# Patient Record
Sex: Female | Born: 1985 | Race: White | Hispanic: No | Marital: Single | Smoking: Former smoker
Health system: Southern US, Community
[De-identification: ages and names within clinical notes are randomized; demographics above are authoritative.]

## PROBLEM LIST (undated history)

## (undated) DIAGNOSIS — F32A Depression, unspecified: Secondary | ICD-10-CM

## (undated) DIAGNOSIS — F329 Major depressive disorder, single episode, unspecified: Secondary | ICD-10-CM

## (undated) DIAGNOSIS — F319 Bipolar disorder, unspecified: Secondary | ICD-10-CM

## (undated) DIAGNOSIS — F419 Anxiety disorder, unspecified: Secondary | ICD-10-CM

## (undated) DIAGNOSIS — I10 Essential (primary) hypertension: Secondary | ICD-10-CM

## (undated) HISTORY — DX: Anxiety disorder, unspecified: F41.9

## (undated) HISTORY — DX: Essential (primary) hypertension: I10

## (undated) HISTORY — PX: WISDOM TOOTH EXTRACTION: SHX21

## (undated) HISTORY — PX: TONSILLECTOMY: SUR1361

## (undated) HISTORY — DX: Major depressive disorder, single episode, unspecified: F32.9

## (undated) HISTORY — DX: Depression, unspecified: F32.A

---
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
## 2007-04-30 ENCOUNTER — Other Ambulatory Visit: Admission: RE | Admit: 2007-04-30 | Discharge: 2007-04-30 | Payer: Self-pay | Admitting: Gynecology

## 2007-07-17 ENCOUNTER — Emergency Department (HOSPITAL_COMMUNITY): Admission: EM | Admit: 2007-07-17 | Discharge: 2007-07-17 | Payer: Self-pay | Admitting: Emergency Medicine

## 2008-01-01 ENCOUNTER — Ambulatory Visit: Payer: Self-pay | Admitting: Gynecology

## 2008-01-01 ENCOUNTER — Other Ambulatory Visit: Admission: RE | Admit: 2008-01-01 | Discharge: 2008-01-01 | Payer: Self-pay | Admitting: Gynecology

## 2008-01-01 ENCOUNTER — Encounter: Payer: Self-pay | Admitting: Gynecology

## 2009-01-06 ENCOUNTER — Other Ambulatory Visit: Admission: RE | Admit: 2009-01-06 | Discharge: 2009-01-06 | Payer: Self-pay | Admitting: Gynecology

## 2009-01-06 ENCOUNTER — Encounter: Payer: Self-pay | Admitting: Gynecology

## 2009-01-06 ENCOUNTER — Ambulatory Visit: Payer: Self-pay | Admitting: Gynecology

## 2009-01-11 ENCOUNTER — Ambulatory Visit: Payer: Self-pay | Admitting: Gynecology

## 2010-01-07 ENCOUNTER — Other Ambulatory Visit: Admission: RE | Admit: 2010-01-07 | Discharge: 2010-01-07 | Payer: Self-pay | Admitting: Gynecology

## 2010-01-07 ENCOUNTER — Ambulatory Visit: Payer: Self-pay | Admitting: Gynecology

## 2010-12-30 ENCOUNTER — Encounter: Payer: Self-pay | Admitting: Anesthesiology

## 2011-01-09 ENCOUNTER — Other Ambulatory Visit (HOSPITAL_COMMUNITY)
Admission: RE | Admit: 2011-01-09 | Discharge: 2011-01-09 | Disposition: A | Discharge status: Home / Self Care | Payer: BC Managed Care – PPO | Source: Ambulatory Visit | Attending: Gynecology | Admitting: Gynecology

## 2011-01-09 ENCOUNTER — Ambulatory Visit (INDEPENDENT_AMBULATORY_CARE_PROVIDER_SITE_OTHER): Payer: BC Managed Care – PPO | Admitting: Gynecology

## 2011-01-09 ENCOUNTER — Encounter: Payer: Self-pay | Admitting: Gynecology

## 2011-01-09 VITALS — BP 130/86 | Ht 62.0 in | Wt 159.0 lb

## 2011-01-09 DIAGNOSIS — Z01419 Encounter for gynecological examination (general) (routine) without abnormal findings: Secondary | ICD-10-CM | POA: Insufficient documentation

## 2011-01-09 DIAGNOSIS — Z131 Encounter for screening for diabetes mellitus: Secondary | ICD-10-CM

## 2011-01-09 DIAGNOSIS — R823 Hemoglobinuria: Secondary | ICD-10-CM

## 2011-01-09 DIAGNOSIS — IMO0001 Reserved for inherently not codable concepts without codable children: Secondary | ICD-10-CM

## 2011-01-09 DIAGNOSIS — N946 Dysmenorrhea, unspecified: Secondary | ICD-10-CM | POA: Insufficient documentation

## 2011-01-09 DIAGNOSIS — R635 Abnormal weight gain: Secondary | ICD-10-CM

## 2011-01-09 DIAGNOSIS — Z113 Encounter for screening for infections with a predominantly sexual mode of transmission: Secondary | ICD-10-CM

## 2011-01-09 DIAGNOSIS — Z309 Encounter for contraceptive management, unspecified: Secondary | ICD-10-CM

## 2011-01-09 MED ORDER — MEFENAMIC ACID 250 MG PO CAPS: 1.0000 | ORAL_CAPSULE | Freq: Four times a day (QID) | ORAL | Status: DC | PRN

## 2011-01-09 MED ORDER — LEVONORGESTREL-ETHINYL ESTRAD 0.1-20 MG-MCG PO TABS: 1.0000 | ORAL_TABLET | Freq: Every day | ORAL | Status: DC

## 2011-01-09 NOTE — Progress Notes (Signed)
Shannon Sherman 1986-02-09 [id number]   History:    25 y.o.  for annual exam with complaint of dysmenorrhea. She has been on Yasmin oral contraceptive pill. She's been followed by Dr. Tomasa Rand her psychiatrist was been monitoring her anxiety and depression. Patient has completed her Gardasil Vaccine. Patient states she does her monthly self breast examination.   Past medical history,surgical history, family history and social history were all reviewed and documented in the EPIC chart.  ROS:  Was performed and pertinent positives and negatives are included in the history.  Exam: chaperone present BP 130/86  Ht 5\' 2"  (1.575 m)  Wt 159 lb (72.122 kg)  BMI 29.08 kg/m2  LMP 12/26/2010  Body mass index is 29.08 kg/(m^2).  General appearance : Well developed well nourished female. No acute distress HEENT: Neck supple, trachea midline, no carotid bruits, no thyroidmegaly Lungs: Clear to auscultation, no rhonchi or wheezes, or rib retractions  Heart: Regular rate and rhythm, no murmurs or gallops Breast:Examined in sitting and supine position were symmetrical in appearance, no palpable masses or tenderness,  no skin retraction, no nipple inversion, no nipple discharge, no skin discoloration, no axillary or supraclavicular lymphadenopathy Abdomen: no palpable masses or tenderness, no rebound or guarding Extremities: no edema or skin discoloration or tenderness  Pelvic:  Bartholin, Urethra, Skene Glands: Within normal limits             Vagina: No gross lesions or discharge  Cervix: No gross lesions or discharge  Uterus  anteverted, normal size, shape and consistency, non-tender and mobile  Adnexa  Without masses or tenderness  Anus and perineum  normal   Rectovaginal  normal sphincter tone without palpated masses or tenderness             Hemoccult not done     Assessment/Plan:  25 y.o. female for annual exam on Yasmin oral contraceptive pill. We discussed the progesterone content of  this oral contraceptive pill (dispirinone) has been reported to potential increased risk of deep venous thrombosis and for this reason I would like to change her to a different oral contraceptive pill. She will be placed on Alesse 28 day oral contraceptive pill and to help with her dysmenorrhea she will take a continuous and withdrawal every 3 months. I've also prescribed Ponstel 250 mg one tablet 4 times a day during her menses for any dysmenorrhea. A GC and Chlamydia culture along with her CBC urinalysis and Pap smear was obtained today. She was encouraged to do her monthly self breast examination. We will see her back in one year or when necessary.   Ok Edwards MD, 12:19 PM 01/09/2011

## 2011-01-10 LAB — POCT RAPID STREP A: Streptococcus, Group A Screen (Direct): NEGATIVE

## 2011-06-23 ENCOUNTER — Telehealth: Payer: Self-pay | Admitting: *Deleted

## 2011-06-23 NOTE — Telephone Encounter (Signed)
Left below message on pt vm as she requested.

## 2011-06-23 NOTE — Telephone Encounter (Signed)
Pt is on Alesse 28 day oral contraceptive pill and to help with her dysmenorrhea she takes continuous and withdrawal every 3 months. Pt said that she had her period 2 weeks ago and last night noted some spotting, this am she had heavy bleeding. She changed 2 tampons within 1 hour, bleeding has slowed down. Pt takes pill at same time every day no missed pills.  Pt asked should she watch for now or make OV? Please advise

## 2011-06-23 NOTE — Telephone Encounter (Signed)
This is been isolated bleed for her. If she's been compliant in taking the pill on a regular basis I would just watch for now but if it continues to be a repetitive issue I need to come to the office for further evaluation. I would also do a home pregnancy test to make sure that she is not pregnant although the birth control pill is 99% effective.

## 2011-08-02 ENCOUNTER — Telehealth: Payer: Self-pay | Admitting: *Deleted

## 2011-08-02 MED ORDER — DROSPIRENONE-ETHINYL ESTRADIOL 3-0.03 MG PO TABS: 1.0000 | ORAL_TABLET | Freq: Every day | ORAL | Status: DC

## 2011-08-02 NOTE — Telephone Encounter (Signed)
Pt is calling requesting to start back on Yasmin birth control pills, she is currently taking Alesse 28 day. C/o ache on her face and her back, pt went to see a dermatologist and tried different treatments and the ache still remains there. Pt said this problem didn't occur until new birth control started. Please advise

## 2011-08-02 NOTE — Telephone Encounter (Signed)
Patient was seen last year in September for her annual exam. She was changed from Yasmin oral contraceptive pill to Alyce 25 year old concept pill as a result of recent findings of slightly increased risk of DVT in patients on Yasmin. Patient would like to return back to the Yasmin and will be prescribed providing that she understands of a slight increased risk. We will prescribe one month supply with 6 refills until we see her in the fall for her annual exam

## 2011-08-02 NOTE — Telephone Encounter (Signed)
Left message on pt voicemail with the below note, rx sent to pharmacy.

## 2012-10-10 ENCOUNTER — Other Ambulatory Visit: Payer: Self-pay

## 2012-10-10 ENCOUNTER — Telehealth: Payer: Self-pay

## 2012-10-10 MED ORDER — DROSPIRENONE-ETHINYL ESTRADIOL 3-0.03 MG PO TABS: 1.0000 | ORAL_TABLET | Freq: Every day | ORAL | Status: DC

## 2012-10-10 NOTE — Telephone Encounter (Signed)
I received faxed from pharmacy stating patient is new to them and she would like to get her Yasmin prescription filled there. They are requesting Rx.  Patient has her yearly scheduled in July. Rx e-scribed.

## 2012-11-05 ENCOUNTER — Encounter: Payer: Self-pay | Admitting: Gynecology

## 2012-11-08 ENCOUNTER — Other Ambulatory Visit: Payer: Self-pay | Admitting: Gynecology

## 2012-11-29 ENCOUNTER — Encounter: Payer: Self-pay | Admitting: Gynecology

## 2012-11-29 ENCOUNTER — Ambulatory Visit (INDEPENDENT_AMBULATORY_CARE_PROVIDER_SITE_OTHER): Payer: BC Managed Care – PPO | Admitting: Gynecology

## 2012-11-29 VITALS — BP 132/78 | Ht 62.0 in | Wt 205.0 lb

## 2012-11-29 DIAGNOSIS — R197 Diarrhea, unspecified: Secondary | ICD-10-CM

## 2012-11-29 DIAGNOSIS — R635 Abnormal weight gain: Secondary | ICD-10-CM

## 2012-11-29 DIAGNOSIS — Z01419 Encounter for gynecological examination (general) (routine) without abnormal findings: Secondary | ICD-10-CM

## 2012-11-29 LAB — CBC WITH DIFFERENTIAL/PLATELET
Basophils Relative: 0 % (ref 0–1)
Hemoglobin: 12.8 g/dL (ref 12.0–15.0)
Lymphs Abs: 2.1 10*3/uL (ref 0.7–4.0)
MCHC: 34.1 g/dL (ref 30.0–36.0)
Monocytes Relative: 9 % (ref 3–12)
Neutro Abs: 2.5 10*3/uL (ref 1.7–7.7)
Neutrophils Relative %: 49 % (ref 43–77)
RBC: 4.19 MIL/uL (ref 3.87–5.11)

## 2012-11-29 LAB — HEMOGLOBIN A1C
Hgb A1c MFr Bld: 5.2 % (ref <5.7)
Mean Plasma Glucose: 103 mg/dL (ref <117)

## 2012-11-29 MED ORDER — DROSPIRENONE-ETHINYL ESTRADIOL 3-0.03 MG PO TABS: 1.0000 | ORAL_TABLET | Freq: Every day | ORAL | Status: DC

## 2012-11-29 NOTE — Progress Notes (Signed)
Shannon Sherman September 29, 1985 [id number]   History:    27 y.o.  for annual gyn exam who has not been seen in the office since 2012. Patient states that for the past month she been complaining of loose stool. Also she been complaining of cramping. She denies any fever. Her partner has gotten L. Patient with increased weight from last year. Patient was weighing 159 now is weighing 205. She is on continuous oral contraceptive pill and withdraws every 3 months and has done well. Patient has completed the Gardasil vaccine series.  Past medical history,surgical history, family history and social history were all reviewed and documented in the EPIC chart.  Gynecologic History Patient's last menstrual period was 11/04/2012. Contraception: OCP (estrogen/progesterone) Last Pap: 2012. Results were: normal Last mammogram: none indicated. Results were: none indicated  Obstetric History OB History  Gravida Para Term Preterm AB SAB TAB Ectopic Multiple Living  0                  ROS: A ROS was performed and pertinent positives and negatives are included in the history.  GENERAL: No fevers or chills. HEENT: No change in vision, no earache, sore throat or sinus congestion. NECK: No pain or stiffness. CARDIOVASCULAR: No chest pain or pressure. No palpitations. PULMONARY: No shortness of breath, cough or wheeze. GASTROINTESTINAL: abdominal cramping and diarrhea but no blood noted GENITOURINARY: No urinary frequency, urgency, hesitancy or dysuria. MUSCULOSKELETAL: No joint or muscle pain, no back pain, no recent trauma. DERMATOLOGIC: No rash, no itching, no lesions. ENDOCRINE: No polyuria, polydipsia, no heat or cold intolerance. No recent change in weight. HEMATOLOGICAL: No anemia or easy bruising or bleeding. NEUROLOGIC: No headache, seizures, numbness, tingling or weakness. PSYCHIATRIC: No depression, no loss of interest in normal activity or change in sleep pattern.     Exam: chaperone present  BP 132/78   Ht 5\' 2"  (1.575 m)  Wt 205 lb (92.987 kg)  BMI 37.49 kg/m2  LMP 11/04/2012  Body mass index is 37.49 kg/(m^2).  General appearance : Well developed well nourished female. No acute distress HEENT: Neck supple, trachea midline, no carotid bruits, no thyroidmegaly Lungs: Clear to auscultation, no rhonchi or wheezes, or rib retractions  Heart: Regular rate and rhythm, no murmurs or gallops Breast:Examined in sitting and supine position were symmetrical in appearance, no palpable masses or tenderness,  no skin retraction, no nipple inversion, no nipple discharge, no skin discoloration, no axillary or supraclavicular lymphadenopathy Abdomen: no palpable masses or tenderness, no rebound or guarding Extremities: no edema or skin discoloration or tenderness  Pelvic:  Bartholin, Urethra, Skene Glands: Within normal limits             Vagina: No gross lesions or discharge  Cervix: No gross lesions or discharge  Uterus  anteverted, normal size, shape and consistency, non-tender and mobile  Adnexa  Without masses or tenderness  Anus and perineum  normal   Rectovaginal  normal sphincter tone without palpated masses or tenderness             Hemoccult none indicated     Assessment/Plan:  27 y.o. female for annual exam was some form of gastroenteritis. She'll be instructed to stay on a full liquid diet for the next 48 hours and uses Imodium when necessary. Will ask her to stop by the lab to obtain a container so that this weekend she can drop off for a specimen of stool for ova and parasite testing. Following last work today: CBC, hemoglobin  A1c, TSH, and comprehensive metabolic panel along with urinalysis. A screen cholesterol be drawn as well. Pap smear not done today in accordance with the new guidelines.    Ok Edwards MD, 10:22 AM 11/29/2012

## 2012-11-29 NOTE — Patient Instructions (Addendum)
Tetanus, Diphtheria, Pertussis (Tdap) Vaccine What You Need to Know WHY GET VACCINATED? Tetanus, diphtheria and pertussis can be very serious diseases, even for adolescents and adults. Tdap vaccine can protect us from these diseases. TETANUS (Lockjaw) causes painful muscle tightening and stiffness, usually all over the body.  It can lead to tightening of muscles in the head and neck so you can't open your mouth, swallow, or sometimes even breathe. Tetanus kills about 1 out of 5 people who are infected. DIPHTHERIA can cause a thick coating to form in the back of the throat.  It can lead to breathing problems, paralysis, heart failure, and death. PERTUSSIS (Whooping Cough) causes severe coughing spells, which can cause difficulty breathing, vomiting and disturbed sleep.  It can also lead to weight loss, incontinence, and rib fractures. Up to 2 in 100 adolescents and 5 in 100 adults with pertussis are hospitalized or have complications, which could include pneumonia and death. These diseases are caused by bacteria. Diphtheria and pertussis are spread from person to person through coughing or sneezing. Tetanus enters the body through cuts, scratches, or wounds. Before vaccines, the [place] saw as many as 200,000 cases a year of diphtheria and pertussis, and hundreds of cases of tetanus. Since vaccination began, tetanus and diphtheria have dropped by about 99% and pertussis by about 80%. TDAP VACCINE Tdap vaccine can protect adolescents and adults from tetanus, diphtheria, and pertussis. One dose of Tdap is routinely given at age 11 or 12. People who did not get Tdap at that age should get it as soon as possible. Tdap is especially important for health care professionals and anyone having close contact with a baby younger than 12 months. Pregnant women should get a dose of Tdap during every pregnancy, to protect the newborn from pertussis. Infants are most at risk for severe, life-threatening  complications from pertussis. A similar vaccine, called Td, protects from tetanus and diphtheria, but not pertussis. A Td booster should be given every 10 years. Tdap may be given as one of these boosters if you have not already gotten a dose. Tdap may also be given after a severe cut or burn to prevent tetanus infection. Your doctor can give you more information. Tdap may safely be given at the same time as other vaccines. SOME PEOPLE SHOULD NOT GET THIS VACCINE  If you ever had a life-threatening allergic reaction after a dose of any tetanus, diphtheria, or pertussis containing vaccine, OR if you have a severe allergy to any part of this vaccine, you should not get Tdap. Tell your doctor if you have any severe allergies.  If you had a coma, or long or multiple seizures within 7 days after a childhood dose of DTP or DTaP, you should not get Tdap, unless a cause other than the vaccine was found. You can still get Td.  Talk to your doctor if you:  have epilepsy or another nervous system problem,  had severe pain or swelling after any vaccine containing diphtheria, tetanus or pertussis,  ever had Guillain-Barr Syndrome (GBS),  aren't feeling well on the day the shot is scheduled. RISKS OF A VACCINE REACTION With any medicine, including vaccines, there is a chance of side effects. These are usually mild and go away on their own, but serious reactions are also possible. Brief fainting spells can follow a vaccination, leading to injuries from falling. Sitting or lying down for about 15 minutes can help prevent these. Tell your doctor if you feel dizzy or light-headed, or   have vision changes or ringing in the ears. Mild problems following Tdap (Did not interfere with activities)  Pain where the shot was given (about 3 in 4 adolescents or 2 in 3 adults)  Redness or swelling where the shot was given (about 1 person in 5)  Mild fever of at least 100.4F (up to about 1 in 25 adolescents or 1 in  100 adults)  Headache (about 3 or 4 people in 10)  Tiredness (about 1 person in 3 or 4)  Nausea, vomiting, diarrhea, stomach ache (up to 1 in 4 adolescents or 1 in 10 adults)  Chills, body aches, sore joints, rash, swollen glands (uncommon) Moderate problems following Tdap (Interfered with activities, but did not require medical attention)  Pain where the shot was given (about 1 in 5 adolescents or 1 in 100 adults)  Redness or swelling where the shot was given (up to about 1 in 16 adolescents or 1 in 25 adults)  Fever over 102F (about 1 in 100 adolescents or 1 in 250 adults)  Headache (about 3 in 20 adolescents or 1 in 10 adults)  Nausea, vomiting, diarrhea, stomach ache (up to 1 or 3 people in 100)  Swelling of the entire arm where the shot was given (up to about 3 in 100). Severe problems following Tdap (Unable to perform usual activities, required medical attention)  Swelling, severe pain, bleeding and redness in the arm where the shot was given (rare). A severe allergic reaction could occur after any vaccine (estimated less than 1 in a million doses). WHAT IF THERE IS A SERIOUS REACTION? What should I look for?  Look for anything that concerns you, such as signs of a severe allergic reaction, very high fever, or behavior changes. Signs of a severe allergic reaction can include hives, swelling of the face and throat, difficulty breathing, a fast heartbeat, dizziness, and weakness. These would start a few minutes to a few hours after the vaccination. What should I do?  If you think it is a severe allergic reaction or other emergency that can't wait, call 9-1-1 or get the person to the nearest hospital. Otherwise, call your doctor.  Afterward, the reaction should be reported to the "Vaccine Adverse Event Reporting System" (VAERS). Your doctor might file this report, or you can do it yourself through the VAERS web site at www.vaers.hhs.gov, or by calling 1-800-822-7967. VAERS is  only for reporting reactions. They do not give medical advice.  THE NATIONAL VACCINE INJURY COMPENSATION PROGRAM The National Vaccine Injury Compensation Program (VICP) is a federal program that was created to compensate people who may have been injured by certain vaccines. Persons who believe they may have been injured by a vaccine can learn about the program and about filing a claim by calling 1-800-338-2382 or visiting the VICP website at www.hrsa.gov/vaccinecompensation. HOW CAN I LEARN MORE?  Ask your doctor.  Call your local or state health department.  Contact the Centers for Disease Control and Prevention (CDC):  Call 1-800-232-4636 or visit CDC's website at www.cdc.gov/vaccines. CDC Tdap Vaccine VIS (08/24/11) Document Released: 10/03/2011 Document Revised: 12/27/2011 Document Reviewed: 10/03/2011 [employer] Patient Information 2014 [employer]. Diarrhea Diarrhea is frequent loose and watery bowel movements. It can cause you to feel weak and dehydrated. Dehydration can cause you to become tired and thirsty, have a dry mouth, and have decreased urination that often is dark yellow. Diarrhea is a sign of another problem, most often an infection that will not last long. In most cases, diarrhea typically   lasts 2 3 days. However, it can last longer if it is a sign of something more serious. It is important to treat your diarrhea as directed by your caregive to lessen or prevent future episodes of diarrhea. CAUSES  Some common causes include:  Gastrointestinal infections caused by viruses, bacteria, or parasites.  Food poisoning or food allergies.  Certain medicines, such as antibiotics, chemotherapy, and laxatives.  Artificial sweeteners and fructose.  Digestive disorders. HOME CARE INSTRUCTIONS  Ensure adequate fluid intake (hydration): have 1 cup (8 oz) of fluid for each diarrhea episode. Avoid fluids that contain simple sugars or sports drinks, fruit juices, whole milk  products, and sodas. Your urine should be clear or pale yellow if you are drinking enough fluids. Hydrate with an oral rehydration solution that you can purchase at pharmacies, retail stores, and online. You can prepare an oral rehydration solution at home by mixing the following ingredients together:    tsp table salt.   tsp baking soda.   tsp salt substitute containing potassium chloride.  1  tablespoons sugar.  1 L (34 oz) of water.  Certain foods and beverages may increase the speed at which food moves through the gastrointestinal (GI) tract. These foods and beverages should be avoided and include:  Caffeinated and alcoholic beverages.  High-fiber foods, such as raw fruits and vegetables, nuts, seeds, and whole grain breads and cereals.  Foods and beverages sweetened with sugar alcohols, such as xylitol, sorbitol, and mannitol.  Some foods may be well tolerated and may help thicken stool including:  Starchy foods, such as rice, toast, pasta, low-sugar cereal, oatmeal, grits, baked potatoes, crackers, and bagels.  Bananas.  Applesauce.  Add probiotic-rich foods to help increase healthy bacteria in the GI tract, such as yogurt and fermented milk products.  Wash your hands well after each diarrhea episode.  Only take over-the-counter or prescription medicines as directed by your caregiver.  Take a warm bath to relieve any burning or pain from frequent diarrhea episodes. SEEK IMMEDIATE MEDICAL CARE IF:   You are unable to keep fluids down.  You have persistent vomiting.  You have blood in your stool, or your stools are black and tarry.  You do not urinate in 6 8 hours, or there is only a small amount of very dark urine.  You have abdominal pain that increases or localizes.  You have weakness, dizziness, confusion, or lightheadedness.  You have a severe headache.  Your diarrhea gets worse or does not get better.  You have a fever or persistent symptoms for more  than 2 3 days.  You have a fever and your symptoms suddenly get worse. MAKE SURE YOU:   Understand these instructions.  Will watch your condition.  Will get help right away if you are not doing well or get worse. Document Released: 03/24/2002 Document Revised: 03/20/2012 Document Reviewed: 12/10/2011 [employer] Patient Information 2014 [employer].  

## 2012-11-30 LAB — URINALYSIS W MICROSCOPIC + REFLEX CULTURE
Bilirubin Urine: NEGATIVE
Casts: NONE SEEN
Glucose, UA: NEGATIVE mg/dL
Ketones, ur: NEGATIVE mg/dL
pH: 6 (ref 5.0–8.0)

## 2012-11-30 LAB — COMPREHENSIVE METABOLIC PANEL
ALT: 17 U/L (ref 0–35)
AST: 26 U/L (ref 0–37)
Alkaline Phosphatase: 42 U/L (ref 39–117)
CO2: 25 mEq/L (ref 19–32)
Sodium: 137 mEq/L (ref 135–145)
Total Bilirubin: 0.3 mg/dL (ref 0.3–1.2)
Total Protein: 6.4 g/dL (ref 6.0–8.3)

## 2012-11-30 LAB — CHOLESTEROL, TOTAL: Cholesterol: 212 mg/dL — ABNORMAL HIGH (ref 0–200)

## 2012-11-30 LAB — TSH: TSH: 2.953 u[IU]/mL (ref 0.350–4.500)

## 2012-12-01 LAB — URINE CULTURE: Colony Count: 4000

## 2012-12-02 ENCOUNTER — Other Ambulatory Visit: Payer: Self-pay | Admitting: Gynecology

## 2012-12-02 DIAGNOSIS — E7800 Pure hypercholesterolemia, unspecified: Secondary | ICD-10-CM

## 2012-12-02 DIAGNOSIS — R3129 Other microscopic hematuria: Secondary | ICD-10-CM

## 2012-12-04 ENCOUNTER — Ambulatory Visit: Payer: BC Managed Care – PPO

## 2012-12-04 DIAGNOSIS — R3129 Other microscopic hematuria: Secondary | ICD-10-CM

## 2012-12-04 DIAGNOSIS — E7800 Pure hypercholesterolemia, unspecified: Secondary | ICD-10-CM

## 2012-12-04 LAB — LIPID PANEL
Total CHOL/HDL Ratio: 2.2 Ratio
VLDL: 12 mg/dL (ref 0–40)

## 2012-12-05 LAB — URINALYSIS W MICROSCOPIC + REFLEX CULTURE
Bilirubin Urine: NEGATIVE
Leukocytes, UA: NEGATIVE
Protein, ur: NEGATIVE mg/dL
Urobilinogen, UA: 0.2 mg/dL (ref 0.0–1.0)

## 2012-12-06 ENCOUNTER — Other Ambulatory Visit: Payer: Self-pay | Admitting: Gynecology

## 2012-12-06 DIAGNOSIS — R3129 Other microscopic hematuria: Secondary | ICD-10-CM

## 2012-12-06 LAB — URINE CULTURE: Colony Count: 7000

## 2013-05-05 ENCOUNTER — Telehealth: Payer: Self-pay | Admitting: *Deleted

## 2013-05-05 NOTE — Telephone Encounter (Signed)
Tell her that the insurance company would give us all a hard time and she may have to foot the bill since I would have to have a medical indication of what I am diagnosing or treating. Sorry.

## 2013-05-05 NOTE — Telephone Encounter (Signed)
Pt informed with the below note. 

## 2013-05-05 NOTE — Telephone Encounter (Signed)
Pt is scheduled to have repeat u/a her physiatrist asked if a liver function test and carbamazepine level could be drawn here? Please advise

## 2013-05-06 ENCOUNTER — Ambulatory Visit: Payer: BC Managed Care – PPO

## 2013-05-06 DIAGNOSIS — R3129 Other microscopic hematuria: Secondary | ICD-10-CM

## 2013-05-07 LAB — URINALYSIS W MICROSCOPIC + REFLEX CULTURE
BILIRUBIN URINE: NEGATIVE
CRYSTALS: NONE SEEN
Casts: NONE SEEN
GLUCOSE, UA: NEGATIVE mg/dL
KETONES UR: NEGATIVE mg/dL
Nitrite: NEGATIVE
PROTEIN: NEGATIVE mg/dL
SPECIFIC GRAVITY, URINE: 1.023 (ref 1.005–1.030)
Urobilinogen, UA: 0.2 mg/dL (ref 0.0–1.0)
pH: 6 (ref 5.0–8.0)

## 2013-05-08 LAB — URINE CULTURE: Colony Count: 2000

## 2013-12-05 ENCOUNTER — Other Ambulatory Visit: Payer: Self-pay | Admitting: Gynecology

## 2014-01-21 ENCOUNTER — Encounter: Payer: BC Managed Care – PPO | Admitting: Gynecology

## 2014-02-03 ENCOUNTER — Ambulatory Visit (INDEPENDENT_AMBULATORY_CARE_PROVIDER_SITE_OTHER): Payer: BC Managed Care – PPO | Admitting: Gynecology

## 2014-02-03 ENCOUNTER — Encounter: Payer: Self-pay | Admitting: Gynecology

## 2014-02-03 ENCOUNTER — Other Ambulatory Visit (HOSPITAL_COMMUNITY)
Admission: RE | Admit: 2014-02-03 | Discharge: 2014-02-03 | Disposition: A | Discharge status: Home / Self Care | Payer: BC Managed Care – PPO | Source: Ambulatory Visit | Attending: Gynecology | Admitting: Gynecology

## 2014-02-03 VITALS — BP 126/80 | Ht 62.0 in | Wt 199.0 lb

## 2014-02-03 DIAGNOSIS — B3731 Acute candidiasis of vulva and vagina: Secondary | ICD-10-CM

## 2014-02-03 DIAGNOSIS — IMO0001 Reserved for inherently not codable concepts without codable children: Secondary | ICD-10-CM

## 2014-02-03 DIAGNOSIS — Z01419 Encounter for gynecological examination (general) (routine) without abnormal findings: Secondary | ICD-10-CM | POA: Insufficient documentation

## 2014-02-03 DIAGNOSIS — Z23 Encounter for immunization: Secondary | ICD-10-CM

## 2014-02-03 DIAGNOSIS — B373 Candidiasis of vulva and vagina: Secondary | ICD-10-CM

## 2014-02-03 DIAGNOSIS — R350 Frequency of micturition: Secondary | ICD-10-CM

## 2014-02-03 LAB — URINALYSIS W MICROSCOPIC + REFLEX CULTURE
Bilirubin Urine: NEGATIVE
CASTS: NONE SEEN
CRYSTALS: NONE SEEN
Glucose, UA: NEGATIVE mg/dL
Leukocytes, UA: NEGATIVE
Nitrite: NEGATIVE
PH: 6 (ref 5.0–8.0)
Protein, ur: NEGATIVE mg/dL
SPECIFIC GRAVITY, URINE: 1.02 (ref 1.005–1.030)
UROBILINOGEN UA: 0.2 mg/dL (ref 0.0–1.0)

## 2014-02-03 MED ORDER — DROSPIRENONE-ETHINYL ESTRADIOL 3-0.03 MG PO TABS: 1.0000 | ORAL_TABLET | Freq: Every day | ORAL | Status: DC

## 2014-02-03 MED ORDER — FLUCONAZOLE 150 MG PO TABS: 150.0000 mg | ORAL_TABLET | Freq: Once | ORAL | Status: DC

## 2014-02-03 NOTE — Patient Instructions (Addendum)
Influenza Virus Vaccine injection (Fluarix) What is this medicine? INFLUENZA VIRUS VACCINE (in floo EN zuh VAHY ruhs vak SEEN) helps to reduce the risk of getting influenza also known as the flu. This medicine may be used for other purposes; ask your health care provider or pharmacist if you have questions. COMMON BRAND NAME(S): Fluarix, Fluzone What should I tell my health care provider before I take this medicine? They need to know if you have any of these conditions: -bleeding disorder like hemophilia -fever or infection -Guillain-Barre syndrome or other neurological problems -immune system problems -infection with the human immunodeficiency virus (HIV) or AIDS -low blood platelet counts -multiple sclerosis -an unusual or allergic reaction to influenza virus vaccine, eggs, chicken proteins, latex, gentamicin, other medicines, foods, dyes or preservatives -pregnant or trying to get pregnant -breast-feeding How should I use this medicine? This vaccine is for injection into a muscle. It is given by a health care professional. A copy of Vaccine Information Statements will be given before each vaccination. Read this sheet carefully each time. The sheet may change frequently. Talk to your pediatrician regarding the use of this medicine in children. Special care may be needed. Overdosage: If you think you have taken too much of this medicine contact a poison control center or emergency room at once. NOTE: This medicine is only for you. Do not share this medicine with others. What if I miss a dose? This does not apply. What may interact with this medicine? -chemotherapy or radiation therapy -medicines that lower your immune system like etanercept, anakinra, infliximab, and adalimumab -medicines that treat or prevent blood clots like warfarin -phenytoin -steroid medicines like prednisone or cortisone -theophylline -vaccines This list may not describe all possible interactions. Give your  health care provider a list of all the medicines, herbs, non-prescription drugs, or dietary supplements you use. Also tell them if you smoke, drink alcohol, or use illegal drugs. Some items may interact with your medicine. What should I watch for while using this medicine? Report any side effects that do not go away within 3 days to your doctor or health care professional. Call your health care provider if any unusual symptoms occur within 6 weeks of receiving this vaccine. You may still catch the flu, but the illness is not usually as bad. You cannot get the flu from the vaccine. The vaccine will not protect against colds or other illnesses that may cause fever. The vaccine is needed every year. What side effects may I notice from receiving this medicine? Side effects that you should report to your doctor or health care professional as soon as possible: -allergic reactions like skin rash, itching or hives, swelling of the face, lips, or tongue Side effects that usually do not require medical attention (report to your doctor or health care professional if they continue or are bothersome): -fever -headache -muscle aches and pains -pain, tenderness, redness, or swelling at site where injected -weak or tired This list may not describe all possible side effects. Call your doctor for medical advice about side effects. You may report side effects to FDA at 1-800-FDA-1088. Where should I keep my medicine? This vaccine is only given in a clinic, pharmacy, doctor's office, or other health care setting and will not be stored at home. NOTE: This sheet is a summary. It may not cover all possible information. If you have questions about this medicine, talk to your doctor, pharmacist, or health care provider.  2015, Elsevier/Gold Standard. (2007-10-30 09:30:40) Monilial Vaginitis Vaginitis in a   soreness, swelling and redness (inflammation) of the vagina and vulva. Monilial vaginitis is not a sexually  transmitted infection. CAUSES  Yeast vaginitis is caused by yeast (candida) that is normally found in your vagina. With a yeast infection, the candida has overgrown in number to a point that upsets the chemical balance. SYMPTOMS   White, thick vaginal discharge.  Swelling, itching, redness and irritation of the vagina and possibly the lips of the vagina (vulva).  Burning or painful urination.  Painful intercourse. DIAGNOSIS  Things that may contribute to monilial vaginitis are:  Postmenopausal and virginal states.  Pregnancy.  Infections.  Being tired, sick or stressed, especially if you had monilial vaginitis in the past.  Diabetes. Good control will help lower the chance.  Birth control pills.  Tight fitting garments.  Using bubble bath, feminine sprays, douches or deodorant tampons.  Taking certain medications that kill germs (antibiotics).  Sporadic recurrence can occur if you become ill. TREATMENT  Your caregiver will give you medication.  There are several kinds of anti monilial vaginal creams and suppositories specific for monilial vaginitis. For recurrent yeast infections, use a suppository or cream in the vagina 2 times a week, or as directed.  Anti-monilial or steroid cream for the itching or irritation of the vulva may also be used. Get your caregiver's permission.  Painting the vagina with methylene blue solution may help if the monilial cream does not work.  Eating yogurt may help prevent monilial vaginitis. HOME CARE INSTRUCTIONS   Finish all medication as prescribed.  Do not have sex until treatment is completed or after your caregiver tells you it is okay.  Take warm sitz baths.  Do not douche.  Do not use tampons, especially scented ones.  Wear cotton underwear.  Avoid tight pants and panty hose.  Tell your sexual partner that you have a yeast infection. They should go to their caregiver if they have symptoms such as mild rash or  itching.  Your sexual partner should be treated as well if your infection is difficult to eliminate.  Practice safer sex. Use condoms.  Some vaginal medications cause latex condoms to fail. Vaginal medications that harm condoms are:  Cleocin cream.  Butoconazole (Femstat).  Terconazole (Terazol) vaginal suppository.  Miconazole (Monistat) (may be purchased over the counter). SEEK MEDICAL CARE IF:   You have a temperature by mouth above 102 F (38.9 C).  The infection is getting worse after 2 days of treatment.  The infection is not getting better after 3 days of treatment.  You develop blisters in or around your vagina.  You develop vaginal bleeding, and it is not your menstrual period.  You have pain when you urinate.  You develop intestinal problems.  You have pain with sexual intercourse. Document Released: 01/11/2005 Document Revised: 06/26/2011 Document Reviewed: 09/25/2008 ExitCare Patient Information 2015 [employer]. This information is not intended to replace advice given to you by your health care provider. Make sure you discuss any questions you have with your health care provider.  

## 2014-02-03 NOTE — Progress Notes (Signed)
Shannon Sherman 1985-10-12 [id number]   History:    28 y.o.  for annual gyn exam complaining of urinary frequency at times and occasional nocturia. Patient denies any stress urinary incontinence. Patient also requesting a flu vaccine. Patient is currently on Tegretol because of her bipolar disorder but also on oral contraceptive pill for contraception. She stated she also uses condoms as well. She is fully aware of the potential decreased patency of the oral contraceptive pill and this is the reason she is using condoms as well. Patient with questionable history of abnormal Pap smear many years ago.  Past medical history,surgical history, family history and social history were all reviewed and documented in the EPIC chart.  Gynecologic History Patient's last menstrual period was 11/03/2013. Contraception: OCP (estrogen/progesterone) Last Pap: 2012. Results were: normal Last mammogram: Not indicated. Results were: Not indicated  Obstetric History OB History  Gravida Para Term Preterm AB SAB TAB Ectopic Multiple Living  0                  ROS: A ROS was performed and pertinent positives and negatives are included in the history.  GENERAL: No fevers or chills. HEENT: No change in vision, no earache, sore throat or sinus congestion. NECK: No pain or stiffness. CARDIOVASCULAR: No chest pain or pressure. No palpitations. PULMONARY: No shortness of breath, cough or wheeze. GASTROINTESTINAL: No abdominal pain, nausea, vomiting or diarrhea, melena or bright red blood per rectum. GENITOURINARY: No urinary frequency, urgency, hesitancy or dysuria. MUSCULOSKELETAL: No joint or muscle pain, no back pain, no recent trauma. DERMATOLOGIC: No rash, no itching, no lesions. ENDOCRINE: No polyuria, polydipsia, no heat or cold intolerance. No recent change in weight. HEMATOLOGICAL: No anemia or easy bruising or bleeding. NEUROLOGIC: No headache, seizures, numbness, tingling or weakness. PSYCHIATRIC: No  depression, no loss of interest in normal activity or change in sleep pattern.     Exam: chaperone present  BP 126/80  Ht 5\' 2"  (1.575 m)  Wt 199 lb (90.266 kg)  BMI 36.39 kg/m2  LMP 11/03/2013  Body mass index is 36.39 kg/(m^2).  General appearance : Well developed well nourished female. No acute distress HEENT: Neck supple, trachea midline, no carotid bruits, no thyroidmegaly Lungs: Clear to auscultation, no rhonchi or wheezes, or rib retractions  Heart: Regular rate and rhythm, no murmurs or gallops Breast:Examined in sitting and supine position were symmetrical in appearance, no palpable masses or tenderness,  no skin retraction, no nipple inversion, no nipple discharge, no skin discoloration, no axillary or supraclavicular lymphadenopathy Abdomen: no palpable masses or tenderness, no rebound or guarding Extremities: no edema or skin discoloration or tenderness  Pelvic:  Bartholin, Urethra, Skene Glands: Within normal limits             Vagina: No gross lesions or discharge  Cervix: No gross lesions or discharge  Uterus  anteverted, normal size, shape and consistency, non-tender and mobile  Adnexa  Without masses or tenderness  Anus and perineum  normal   Rectovaginal  normal sphincter tone without palpated masses or tenderness             Hemoccult not indicated   Urinalysis: 710 rbc, few bacteria, yeast  Assessment/Plan:  28 y.o. female for annual exam who was reminded of the potential decrease efficacy of the oral contraceptive pill while she is on Tegretol. This is why she is reminded to use some form of such as a condom. I did give her literature information on the  ParaGard T380A IUD. She was reminded of the importance of monthly breast exam. She will be prescribed 150 mg  one by mouth today because of yeast found in her urine collection today. We will await the results of the urine culture. The following labs were ordered: CBC, comprehensive metabolic, TSH, screening  cholesterol, and Pap smear patient received the flu vaccine today.   one by mouth daily Ok EdwardsFERNANDEZ,Bryne Lindon H MD, 5:17 PM 02/03/2014

## 2014-02-04 ENCOUNTER — Other Ambulatory Visit: Payer: Self-pay | Admitting: Gynecology

## 2014-02-04 DIAGNOSIS — E7800 Pure hypercholesterolemia, unspecified: Secondary | ICD-10-CM

## 2014-02-04 LAB — CBC WITH DIFFERENTIAL/PLATELET
BASOS ABS: 0 10*3/uL (ref 0.0–0.1)
BASOS PCT: 0 % (ref 0–1)
EOS ABS: 0.1 10*3/uL (ref 0.0–0.7)
EOS PCT: 1 % (ref 0–5)
HEMATOCRIT: 36.2 % (ref 36.0–46.0)
Hemoglobin: 12.8 g/dL (ref 12.0–15.0)
Lymphocytes Relative: 39 % (ref 12–46)
Lymphs Abs: 3.2 10*3/uL (ref 0.7–4.0)
MCH: 30.4 pg (ref 26.0–34.0)
MCHC: 35.4 g/dL (ref 30.0–36.0)
MCV: 86 fL (ref 78.0–100.0)
MONO ABS: 0.8 10*3/uL (ref 0.1–1.0)
Monocytes Relative: 10 % (ref 3–12)
Neutro Abs: 4.1 10*3/uL (ref 1.7–7.7)
Neutrophils Relative %: 50 % (ref 43–77)
Platelets: 258 10*3/uL (ref 150–400)
RBC: 4.21 MIL/uL (ref 3.87–5.11)
RDW: 13.1 % (ref 11.5–15.5)
WBC: 8.2 10*3/uL (ref 4.0–10.5)

## 2014-02-04 LAB — COMPREHENSIVE METABOLIC PANEL
ALK PHOS: 38 U/L — AB (ref 39–117)
ALT: 19 U/L (ref 0–35)
AST: 17 U/L (ref 0–37)
Albumin: 4 g/dL (ref 3.5–5.2)
BUN: 10 mg/dL (ref 6–23)
CALCIUM: 9.2 mg/dL (ref 8.4–10.5)
CHLORIDE: 100 meq/L (ref 96–112)
CO2: 24 mEq/L (ref 19–32)
CREATININE: 0.77 mg/dL (ref 0.50–1.10)
Glucose, Bld: 78 mg/dL (ref 70–99)
Potassium: 4 mEq/L (ref 3.5–5.3)
Sodium: 136 mEq/L (ref 135–145)
Total Bilirubin: 0.4 mg/dL (ref 0.2–1.2)
Total Protein: 6.5 g/dL (ref 6.0–8.3)

## 2014-02-04 LAB — CHOLESTEROL, TOTAL: Cholesterol: 233 mg/dL — ABNORMAL HIGH (ref 0–200)

## 2014-02-05 LAB — URINE CULTURE: Colony Count: 4000

## 2014-02-06 LAB — CYTOLOGY - PAP

## 2014-04-25 ENCOUNTER — Encounter (HOSPITAL_COMMUNITY): Payer: Self-pay | Admitting: Emergency Medicine

## 2014-04-25 ENCOUNTER — Emergency Department (HOSPITAL_COMMUNITY)
Admission: EM | Admit: 2014-04-25 | Discharge: 2014-04-25 | Disposition: A | Discharge status: Home / Self Care | Payer: BLUE CROSS/BLUE SHIELD | Source: Home / Self Care | Attending: Emergency Medicine | Admitting: Emergency Medicine

## 2014-04-25 ENCOUNTER — Ambulatory Visit (HOSPITAL_COMMUNITY): Payer: BLUE CROSS/BLUE SHIELD | Attending: Emergency Medicine

## 2014-04-25 DIAGNOSIS — R05 Cough: Secondary | ICD-10-CM | POA: Diagnosis not present

## 2014-04-25 DIAGNOSIS — R059 Cough, unspecified: Secondary | ICD-10-CM

## 2014-04-25 DIAGNOSIS — R079 Chest pain, unspecified: Secondary | ICD-10-CM | POA: Insufficient documentation

## 2014-04-25 DIAGNOSIS — J189 Pneumonia, unspecified organism: Secondary | ICD-10-CM

## 2014-04-25 HISTORY — DX: Bipolar disorder, unspecified: F31.9

## 2014-04-25 MED ORDER — CEFTRIAXONE SODIUM 1 G IJ SOLR
1.0000 g | Freq: Once | INTRAMUSCULAR | Status: AC
  Administered 2014-04-25: 1 g via INTRAMUSCULAR

## 2014-04-25 MED ORDER — HYDROCOD POLST-CHLORPHEN POLST 10-8 MG/5ML PO LQCR: 5.0000 mL | Freq: Two times a day (BID) | ORAL | Status: DC | PRN

## 2014-04-25 MED ORDER — CEFTRIAXONE SODIUM 1 G IJ SOLR
INTRAMUSCULAR | Status: AC
  Filled 2014-04-25: qty 10

## 2014-04-25 MED ORDER — LIDOCAINE HCL (PF) 1 % IJ SOLN
INTRAMUSCULAR | Status: AC
  Filled 2014-04-25: qty 5

## 2014-04-25 MED ORDER — AMOXICILLIN-POT CLAVULANATE 875-125 MG PO TABS: 1.0000 | ORAL_TABLET | Freq: Two times a day (BID) | ORAL | Status: DC

## 2014-04-25 MED ORDER — AZITHROMYCIN 250 MG PO TABS: ORAL_TABLET | ORAL | Status: DC

## 2014-04-25 NOTE — ED Provider Notes (Signed)
Chief Complaint   Cough and Nasal Congestion   History of Present Illness   Shannon Sherman is a 29 year old female who's had a three-week history of a cough productive of brownish sputum, shortness of breath, chest pain, nasal congestion, rhinorrhea, chills, and headache. She denies any fever, sore throat, or GI symptoms. She went to a [facility] this afternoon, where the provider there was concerned about pneumonia and sent her over here. She's had no prior history of pneumonia or asthma.  Review of Systems   Other than as noted above, the patient denies any of the following symptoms: Systemic:  No fevers, chills, sweats, or myalgias. Eye:  No redness or discharge. ENT:  No ear pain, headache, nasal congestion, drainage, sinus pressure, or sore throat. Neck:  No neck pain, stiffness, or swollen glands. Lungs:  No cough, sputum production, hemoptysis, wheezing, chest tightness, shortness of breath or chest pain. GI:  No abdominal pain, nausea, vomiting or diarrhea.  PMFSH   Past medical history, family history, social history, meds, and allergies were reviewed. The patient takes Tegretol, Wellbutrin, birth control pills, and Xanax. She has bipolar disorder and depression.  Physical exam   Vital signs:  BP 158/87 mmHg  Pulse 115  Temp(Src) 99.7 F (37.6 C) (Oral)  Resp 16  SpO2 94%  LMP 04/20/2013 General:  Alert and oriented.  In no distress.  Skin warm and dry. Eye:  No conjunctival injection or drainage. Lids were normal. ENT:  TMs and canals were normal, without erythema or inflammation.  Nasal mucosa was clear and uncongested, without drainage.  Mucous membranes were moist.  Pharynx was clear with no exudate or drainage.  There were no oral ulcerations or lesions. Neck:  Supple, no adenopathy, tenderness or mass. Lungs:  No respiratory distress.  There are diffuse rales in the entire left lung. No wheezes or rhonchi. Good air movement.  Heart:  Regular rhythm,  without gallops, murmers or rubs. Skin:  Clear, warm, and dry, without rash or lesions.  Radiology   Dg Chest 2 View  04/25/2014   CLINICAL DATA:  Productive cough for 2 weeks, chest pain  EXAM: CHEST  2 VIEW  COMPARISON:  None.  FINDINGS: There is left perihilar airspace disease most concerning for pneumonia. There is no pleural effusion or pneumothorax. The heart and mediastinal contours are unremarkable.  The osseous structures are unremarkable.  IMPRESSION: Left perihilar pneumonia.   Electronically Signed   By: Elige Ko   On: 04/25/2014 16:57      Course in Urgent Care Center   The following medications were given:  Medications  cefTRIAXone (ROCEPHIN) injection 1 g (1 g Intramuscular Given 04/25/14 1729)   Assessment     The primary encounter diagnosis was Community acquired pneumonia. A diagnosis of Cough was also pertinent to this visit.  Plan    1.  Meds:  The following meds were prescribed:   New Prescriptions   AMOXICILLIN-CLAVULANATE (AUGMENTIN) 875-125 MG PER TABLET    Take 1 tablet by mouth 2 (two) times daily.   AZITHROMYCIN (ZITHROMAX Z-PAK) 250 MG TABLET    Take as directed.   CHLORPHENIRAMINE-HYDROCODONE (TUSSIONEX) 10-8 MG/5ML LQCR    Take 5 mLs by mouth every 12 (twelve) hours as needed for cough.    2.  Patient Education/Counseling:  The patient was given appropriate handouts, self care instructions, and instructed in symptomatic relief.  Instructed to get extra fluids and extra rest.    3.  Follow up:  The patient was told to follow up here or with her primary care physician in 2 days for a scheduled recheck, or sooner if becoming worse in any way, and given some red flag symptoms such as increasing fever, difficulty breathing, chest pain, or persistent vomiting which would prompt immediate return.       Reuben Likesavid C Braelyn Bordonaro, MD 04/25/14 [id number]

## 2014-04-25 NOTE — ED Notes (Signed)
Pt states that she has had cough since Christmas and it has gotten worse since then she went to the [facility] and was told to come to urgent care.

## 2014-04-25 NOTE — Discharge Instructions (Signed)

## 2014-05-19 ENCOUNTER — Ambulatory Visit
Admission: RE | Admit: 2014-05-19 | Discharge: 2014-05-19 | Disposition: A | Discharge status: Home / Self Care | Payer: BLUE CROSS/BLUE SHIELD | Source: Ambulatory Visit | Attending: Physician Assistant | Admitting: Physician Assistant

## 2014-05-19 ENCOUNTER — Other Ambulatory Visit: Payer: Self-pay | Admitting: Physician Assistant

## 2014-05-19 DIAGNOSIS — J189 Pneumonia, unspecified organism: Secondary | ICD-10-CM

## 2014-11-24 ENCOUNTER — Other Ambulatory Visit: Payer: Self-pay

## 2014-11-24 MED ORDER — DROSPIRENONE-ETHINYL ESTRADIOL 3-0.03 MG PO TABS: 1.0000 | ORAL_TABLET | Freq: Every day | ORAL | Status: DC

## 2015-02-01 ENCOUNTER — Other Ambulatory Visit: Payer: Self-pay | Admitting: Gynecology

## 2015-03-04 ENCOUNTER — Other Ambulatory Visit (HOSPITAL_COMMUNITY)
Admission: RE | Admit: 2015-03-04 | Discharge: 2015-03-04 | Disposition: A | Discharge status: Home / Self Care | Payer: BLUE CROSS/BLUE SHIELD | Source: Ambulatory Visit | Attending: Gynecology | Admitting: Gynecology

## 2015-03-04 ENCOUNTER — Ambulatory Visit (INDEPENDENT_AMBULATORY_CARE_PROVIDER_SITE_OTHER): Payer: BC Managed Care – PPO | Admitting: Gynecology

## 2015-03-04 ENCOUNTER — Encounter: Payer: Self-pay | Admitting: Gynecology

## 2015-03-04 VITALS — BP 126/78 | Ht 62.0 in | Wt 190.0 lb

## 2015-03-04 DIAGNOSIS — Z1151 Encounter for screening for human papillomavirus (HPV): Secondary | ICD-10-CM | POA: Diagnosis present

## 2015-03-04 DIAGNOSIS — Z01419 Encounter for gynecological examination (general) (routine) without abnormal findings: Secondary | ICD-10-CM | POA: Insufficient documentation

## 2015-03-04 DIAGNOSIS — Z23 Encounter for immunization: Secondary | ICD-10-CM | POA: Diagnosis not present

## 2015-03-04 MED ORDER — DROSPIRENONE-ETHINYL ESTRADIOL 3-0.03 MG PO TABS: 1.0000 | ORAL_TABLET | Freq: Every day | ORAL | Status: DC

## 2015-03-04 NOTE — Addendum Note (Signed)
Addended by: Kem ParkinsonBARNES, Kailand Seda on: 03/04/2015 04:46 PM   Modules accepted: Orders

## 2015-03-04 NOTE — Progress Notes (Signed)
Shannon Sherman 09-28-1985 [id number]   History:    29 y.o.  for annual gyn exam with no complaints today. Patient has done well with her continuous oral contraceptive pill whereby she withdrawals every 3 months. This is help with her dysmenorrhea. Patient requesting flu vaccine today. Patient many years ago stated she had an abnormal Pap smear but cleared upon repeat no other intervention. We did have a normal Pap smear last year.Patient is currently on Tegretol because of her bipolar disorder but also on oral contraceptive pill for contraception. She stated she also uses condoms as well. She is fully aware of the potential decreased potency of the oral contraceptive pill and this is the reason she is using condoms as well.  Past medical history,surgical history, family history and social history were all reviewed and documented in the EPIC chart.  Gynecologic History Patient's last menstrual period was 02/01/2015. Contraception: OCP (estrogen/progesterone) Last Pap: 2015. Results were: normal Last mammogram: Not indicated. Results were: Not indicated  Obstetric History OB History  Gravida Para Term Preterm AB SAB TAB Ectopic Multiple Living  0                  ROS: A ROS was performed and pertinent positives and negatives are included in the history.  GENERAL: No fevers or chills. HEENT: No change in vision, no earache, sore throat or sinus congestion. NECK: No pain or stiffness. CARDIOVASCULAR: No chest pain or pressure. No palpitations. PULMONARY: No shortness of breath, cough or wheeze. GASTROINTESTINAL: No abdominal pain, nausea, vomiting or diarrhea, melena or bright red blood per rectum. GENITOURINARY: No urinary frequency, urgency, hesitancy or dysuria. MUSCULOSKELETAL: No joint or muscle pain, no back pain, no recent trauma. DERMATOLOGIC: No rash, no itching, no lesions. ENDOCRINE: No polyuria, polydipsia, no heat or cold intolerance. No recent change in weight. HEMATOLOGICAL: No  anemia or easy bruising or bleeding. NEUROLOGIC: No headache, seizures, numbness, tingling or weakness. PSYCHIATRIC: No depression, no loss of interest in normal activity or change in sleep pattern.     Exam: chaperone present  BP 126/78 mmHg  Ht 5\' 2"  (1.575 m)  Wt 190 lb (86.183 kg)  BMI 34.74 kg/m2  LMP 02/01/2015  Body mass index is 34.74 kg/(m^2).  General appearance : Well developed well nourished female. No acute distress HEENT: Eyes: no retinal hemorrhage or exudates,  Neck supple, trachea midline, no carotid bruits, no thyroidmegaly Lungs: Clear to auscultation, no rhonchi or wheezes, or rib retractions  Heart: Regular rate and rhythm, no murmurs or gallops Breast:Examined in sitting and supine position were symmetrical in appearance, no palpable masses or tenderness,  no skin retraction, no nipple inversion, no nipple discharge, no skin discoloration, no axillary or supraclavicular lymphadenopathy Abdomen: no palpable masses or tenderness, no rebound or guarding Extremities: no edema or skin discoloration or tenderness  Pelvic:  Bartholin, Urethra, Skene Glands: Within normal limits             Vagina: No gross lesions or discharge  Cervix: No gross lesions or discharge  Uterus  anteverted, normal size, shape and consistency, non-tender and mobile  Adnexa  Without masses or tenderness  Anus and perineum  normal   Rectovaginal  normal sphincter tone without palpated masses or tenderness             Hemoccult not indicated     Assessment/Plan:  29 y.o. female for annual exam or return back next week in a fasting state for the following screening blood  work: Research officer, political party, fasting lipid profile, TSH, CBC, and urinalysis. Last year her screening cholesterol was at 233 she did not come back for fasting lipid profile. We'll do 1 more Pap smear this year and if negative will then follow the new guidelines Pap smears every 3 years. We discussed importance of  monthly breast exams. Patient received flu vaccine today.   Ok Edwards MD, 4:28 PM 03/04/2015

## 2015-03-05 LAB — URINALYSIS W MICROSCOPIC + REFLEX CULTURE
BACTERIA UA: NONE SEEN [HPF]
BILIRUBIN URINE: NEGATIVE
CRYSTALS: NONE SEEN [HPF]
Casts: NONE SEEN [LPF]
Glucose, UA: NEGATIVE
KETONES UR: NEGATIVE
Nitrite: NEGATIVE
Protein, ur: NEGATIVE
Specific Gravity, Urine: 1.004 (ref 1.001–1.035)
Yeast: NONE SEEN [HPF]
pH: 6 (ref 5.0–8.0)

## 2015-03-06 LAB — URINE CULTURE: Colony Count: 30000

## 2015-03-08 ENCOUNTER — Other Ambulatory Visit: Payer: Self-pay | Admitting: Gynecology

## 2015-03-08 DIAGNOSIS — R3129 Other microscopic hematuria: Secondary | ICD-10-CM

## 2015-03-08 LAB — CYTOLOGY - PAP

## 2015-04-05 ENCOUNTER — Other Ambulatory Visit: Payer: BC Managed Care – PPO

## 2015-04-05 DIAGNOSIS — R3129 Other microscopic hematuria: Secondary | ICD-10-CM

## 2015-04-05 DIAGNOSIS — Z01419 Encounter for gynecological examination (general) (routine) without abnormal findings: Secondary | ICD-10-CM

## 2015-04-05 LAB — COMPREHENSIVE METABOLIC PANEL WITH GFR
ALT: 16 U/L (ref 6–29)
AST: 15 U/L (ref 10–30)
Albumin: 3.7 g/dL (ref 3.6–5.1)
Alkaline Phosphatase: 34 U/L (ref 33–115)
BUN: 10 mg/dL (ref 7–25)
CO2: 26 mmol/L (ref 20–31)
Calcium: 8.6 mg/dL (ref 8.6–10.2)
Chloride: 101 mmol/L (ref 98–110)
Creat: 0.76 mg/dL (ref 0.50–1.10)
Glucose, Bld: 87 mg/dL (ref 65–99)
Potassium: 4.2 mmol/L (ref 3.5–5.3)
Sodium: 135 mmol/L (ref 135–146)
Total Bilirubin: 0.4 mg/dL (ref 0.2–1.2)
Total Protein: 6.4 g/dL (ref 6.1–8.1)

## 2015-04-05 LAB — CBC WITH DIFFERENTIAL/PLATELET
Basophils Absolute: 0 K/uL (ref 0.0–0.1)
Basophils Relative: 0 % (ref 0–1)
Eosinophils Absolute: 0.1 K/uL (ref 0.0–0.7)
Eosinophils Relative: 2 % (ref 0–5)
HCT: 36.8 % (ref 36.0–46.0)
Hemoglobin: 12.9 g/dL (ref 12.0–15.0)
Lymphocytes Relative: 28 % (ref 12–46)
Lymphs Abs: 1.9 K/uL (ref 0.7–4.0)
MCH: 30.9 pg (ref 26.0–34.0)
MCHC: 35.1 g/dL (ref 30.0–36.0)
MCV: 88.2 fL (ref 78.0–100.0)
MPV: 9.6 fL (ref 8.6–12.4)
Monocytes Absolute: 0.6 K/uL (ref 0.1–1.0)
Monocytes Relative: 9 % (ref 3–12)
Neutro Abs: 4.1 K/uL (ref 1.7–7.7)
Neutrophils Relative %: 61 % (ref 43–77)
Platelets: 248 K/uL (ref 150–400)
RBC: 4.17 MIL/uL (ref 3.87–5.11)
RDW: 13.4 % (ref 11.5–15.5)
WBC: 6.7 K/uL (ref 4.0–10.5)

## 2015-04-05 LAB — URINALYSIS W MICROSCOPIC + REFLEX CULTURE
Bacteria, UA: NONE SEEN [HPF]
Bilirubin Urine: NEGATIVE
Casts: NONE SEEN [LPF]
Crystals: NONE SEEN [HPF]
Glucose, UA: NEGATIVE
Ketones, ur: NEGATIVE
Leukocytes, UA: NEGATIVE
Nitrite: NEGATIVE
Protein, ur: NEGATIVE
Specific Gravity, Urine: 1.016 (ref 1.001–1.035)
Yeast: NONE SEEN [HPF]
pH: 6 (ref 5.0–8.0)

## 2015-04-05 LAB — LIPID PANEL
CHOL/HDL RATIO: 2 ratio (ref <=5.0)
CHOLESTEROL: 210 mg/dL — AB (ref 125–200)
HDL: 104 mg/dL (ref >=46)
LDL Cholesterol: 79 mg/dL (ref <130)
Triglycerides: 136 mg/dL (ref <150)
VLDL: 27 mg/dL (ref <30)

## 2015-04-05 LAB — TSH: TSH: 3.047 u[IU]/mL (ref 0.350–4.500)

## 2015-04-06 ENCOUNTER — Other Ambulatory Visit: Payer: Self-pay | Admitting: Gynecology

## 2015-04-06 ENCOUNTER — Telehealth: Payer: Self-pay | Admitting: *Deleted

## 2015-04-06 DIAGNOSIS — R3129 Other microscopic hematuria: Secondary | ICD-10-CM

## 2015-04-06 LAB — URINE CULTURE
COLONY COUNT: NO GROWTH
ORGANISM ID, BACTERIA: NO GROWTH

## 2015-04-06 NOTE — Telephone Encounter (Signed)
-----   Message from Keenan BachelorKatherine R Annas, [place] sent at 04/06/2015 10:35 AM EST ----- Regarding: urology appt Per Dr. Glenetta HewJF   We need to refer her to the [facility] secondary to persistent microscopic hematuria.  Patient informed. Thanks!!

## 2015-04-06 NOTE — Telephone Encounter (Signed)
Referral faxed they will fax me back with time and date to relay to patient.  

## 2015-04-15 NOTE — Telephone Encounter (Signed)
Appointment on 05/21/15 @ 2:45pm with Dr.Eskridge, left message for pt to call.

## 2015-04-20 NOTE — Telephone Encounter (Signed)
Pt aware.

## 2015-09-01 ENCOUNTER — Telehealth: Payer: Self-pay

## 2015-09-01 MED ORDER — DROSPIRENONE-ETHINYL ESTRADIOL 3-0.03 MG PO TABS: ORAL_TABLET | ORAL | Status: DC

## 2015-09-01 NOTE — Telephone Encounter (Signed)
Withdrawal every 3 months

## 2015-09-01 NOTE — Telephone Encounter (Signed)
Rx sent with directions revised to reflect one active pill qd but at end of every3rd pack should be off 7 days for withdrawal "period".

## 2015-09-01 NOTE — Telephone Encounter (Signed)
Pharmacy faxed a note regarding patient's Ocella 3-0.03mg  tab.  "Patient says she does not take placebo pills. Please send new Rx with directions to skip placebos. Thanks"  Please advise if this is okay.

## 2016-01-25 ENCOUNTER — Telehealth: Payer: Self-pay | Admitting: *Deleted

## 2016-01-25 MED ORDER — DROSPIRENONE-ETHINYL ESTRADIOL 3-0.03 MG PO TABS: ORAL_TABLET | ORAL | 0 refills | Status: DC

## 2016-01-25 NOTE — Telephone Encounter (Signed)
Pt called requesting refill on birth control pills,annaul due in Nov. Will schedule. Rx sent

## 2016-03-14 ENCOUNTER — Ambulatory Visit (INDEPENDENT_AMBULATORY_CARE_PROVIDER_SITE_OTHER): Payer: BC Managed Care – PPO | Admitting: Gynecology

## 2016-03-14 ENCOUNTER — Encounter: Payer: Self-pay | Admitting: Gynecology

## 2016-03-14 VITALS — BP 124/82 | Ht 62.0 in | Wt 218.0 lb

## 2016-03-14 DIAGNOSIS — Z23 Encounter for immunization: Secondary | ICD-10-CM

## 2016-03-14 DIAGNOSIS — Z01419 Encounter for gynecological examination (general) (routine) without abnormal findings: Secondary | ICD-10-CM | POA: Diagnosis not present

## 2016-03-14 MED ORDER — DROSPIRENONE-ETHINYL ESTRADIOL 3-0.03 MG PO TABS: ORAL_TABLET | ORAL | 4 refills | Status: DC

## 2016-03-14 NOTE — Patient Instructions (Signed)
Influenza Virus Vaccine (Flucelvax) What is this medicine? INFLUENZA VIRUS VACCINE (in floo EN zuh VAHY ruhs vak SEEN) helps to reduce the risk of getting influenza also known as the flu. The vaccine only helps protect you against some strains of the flu. COMMON BRAND NAME(S): FLUCELVAX What should I tell my health care provider before I take this medicine? They need to know if you have any of these conditions: -bleeding disorder like hemophilia -fever or infection -Guillain-Barre syndrome or other neurological problems -immune system problems -infection with the human immunodeficiency virus (HIV) or AIDS -low blood platelet counts -multiple sclerosis -an unusual or allergic reaction to influenza virus vaccine, other medicines, foods, dyes or preservatives -pregnant or trying to get pregnant -breast-feeding How should I use this medicine? This vaccine is for injection into a muscle. It is given by a health care professional. A copy of Vaccine Information Statements will be given before each vaccination. Read this sheet carefully each time. The sheet may change frequently. Talk to your pediatrician regarding the use of this medicine in children. Special care may be needed. Overdosage: If you think you've taken too much of this medicine contact a poison control center or emergency room at once. What if I miss a dose? This does not apply. What may interact with this medicine? -chemotherapy or radiation therapy -medicines that lower your immune system like etanercept, anakinra, infliximab, and adalimumab -medicines that treat or prevent blood clots like warfarin -phenytoin -steroid medicines like prednisone or cortisone -theophylline -vaccines What should I watch for while using this medicine? Report any side effects that do not go away within 3 days to your doctor or health care professional. Call your health care provider if any unusual symptoms occur within 6 weeks of receiving this  vaccine. You may still catch the flu, but the illness is not usually as bad. You cannot get the flu from the vaccine. The vaccine will not protect against colds or other illnesses that may cause fever. The vaccine is needed every year. What side effects may I notice from receiving this medicine? Side effects that you should report to your doctor or health care professional as soon as possible: -allergic reactions like skin rash, itching or hives, swelling of the face, lips, or tongue Side effects that usually do not require medical attention (Report these to your doctor or health care professional if they continue or are bothersome.): -fever -headache -muscle aches and pains -pain, tenderness, redness, or swelling at the injection site -tiredness Where should I keep my medicine? The vaccine will be given by a health care professional in a clinic, pharmacy, doctor's office, or other health care setting. You will not be given vaccine doses to store at home.  2017 Elsevier/Gold Standard (2011-03-15 14:06:47)  

## 2016-03-14 NOTE — Progress Notes (Signed)
Shannon Sherman 1985-11-19 [id number]   History:    30 y.o.  for annual gyn exam who is having no complaints today. Patient has done well with her continuous oral contraceptive pill whereby she withdrawals every 3 months. This is help with her dysmenorrhea. Patient requesting flu vaccine today. Patient many years ago stated she had an abnormal Pap smear but cleared upon repeat no other intervention. We did have a normal Pap smear last year.Patient is currently on Tegretol because of her bipolar disorder but also on oral contraceptive pill for contraception. She stated she also uses condoms as well. She is fully aware of the potential decreased potency of the oral contraceptive pill and this is the reason she is using condoms as well.   Past medical history,surgical history, family history and social history were all reviewed and documented in the EPIC chart.  Gynecologic History Patient's last menstrual period was 02/22/2016. Contraception: OCP (estrogen/progesterone) Last Pap: 2012, 2015, 2016. Results were: normal Last mammogram: Not indicated. Results were: Not indicated  Obstetric History OB History  Gravida Para Term Preterm AB Living  0            SAB TAB Ectopic Multiple Live Births                    ROS: A ROS was performed and pertinent positives and negatives are included in the history.  GENERAL: No fevers or chills. HEENT: No change in vision, no earache, sore throat or sinus congestion. NECK: No pain or stiffness. CARDIOVASCULAR: No chest pain or pressure. No palpitations. PULMONARY: No shortness of breath, cough or wheeze. GASTROINTESTINAL: No abdominal pain, nausea, vomiting or diarrhea, melena or bright red blood per rectum. GENITOURINARY: No urinary frequency, urgency, hesitancy or dysuria. MUSCULOSKELETAL: No joint or muscle pain, no back pain, no recent trauma. DERMATOLOGIC: No rash, no itching, no lesions. ENDOCRINE: No polyuria, polydipsia, no heat or cold  intolerance. No recent change in weight. HEMATOLOGICAL: No anemia or easy bruising or bleeding. NEUROLOGIC: No headache, seizures, numbness, tingling or weakness. PSYCHIATRIC: No depression, no loss of interest in normal activity or change in sleep pattern.     Exam: chaperone present  BP 124/82   Ht 5\' 2"  (1.575 m)   Wt 218 lb (98.9 kg)   LMP 02/22/2016   BMI 39.87 kg/m   Body mass index is 39.87 kg/m.  General appearance : Well developed well nourished female. No acute distress HEENT: Eyes: no retinal hemorrhage or exudates,  Neck supple, trachea midline, no carotid bruits, no thyroidmegaly Lungs: Clear to auscultation, no rhonchi or wheezes, or rib retractions  Heart: Regular rate and rhythm, no murmurs or gallops Breast:Examined in sitting and supine position were symmetrical in appearance, no palpable masses or tenderness,  no skin retraction, no nipple inversion, no nipple discharge, no skin discoloration, no axillary or supraclavicular lymphadenopathy Abdomen: no palpable masses or tenderness, no rebound or guarding Extremities: no edema or skin discoloration or tenderness  Pelvic:  Bartholin, Urethra, Skene Glands: Within normal limits             Vagina: No gross lesions or discharge  Cervix: No gross lesions or discharge  Uterus  anteverted, normal size, shape and consistency, non-tender and mobile  Adnexa  Without masses or tenderness  Anus and perineum  normal   Rectovaginal  normal sphincter tone without palpated masses or tenderness             Hemoccult not indicated  Assessment/Plan:  30 y.o. female for annual exam will return back to the office in a few weeks for the following screening blood work: Comprehensive metabolic panel, fasting lipid profile, TSH, CBC, and urinalysis. Pap smear not indicated this year. Prescription refill for oral contraceptive pill provided. Patient was counseled and received a flu vaccine today.   Ok EdwardsFERNANDEZ,[name] H MD, 4:28 PM  03/14/2016

## 2016-03-15 LAB — URINALYSIS W MICROSCOPIC + REFLEX CULTURE
BACTERIA UA: NONE SEEN [HPF]
Bilirubin Urine: NEGATIVE
CASTS: NONE SEEN [LPF]
Crystals: NONE SEEN [HPF]
Glucose, UA: NEGATIVE
Leukocytes, UA: NEGATIVE
Nitrite: NEGATIVE
PROTEIN: NEGATIVE
Specific Gravity, Urine: 1.018 (ref 1.001–1.035)
WBC, UA: NONE SEEN WBC/HPF (ref <=5)
Yeast: NONE SEEN [HPF]
pH: 6 (ref 5.0–8.0)

## 2016-03-16 LAB — URINE CULTURE

## 2016-03-17 ENCOUNTER — Other Ambulatory Visit: Payer: Self-pay | Admitting: Gynecology

## 2016-03-17 DIAGNOSIS — R3129 Other microscopic hematuria: Secondary | ICD-10-CM

## 2016-07-29 IMAGING — DX DG CHEST 2V
2 series · 2 of 2 positions shown · non-contrast
Comparison: None.

CLINICAL DATA: Productive cough for 2 weeks, chest pain

EXAM:
CHEST  2 VIEW

[chest pa]
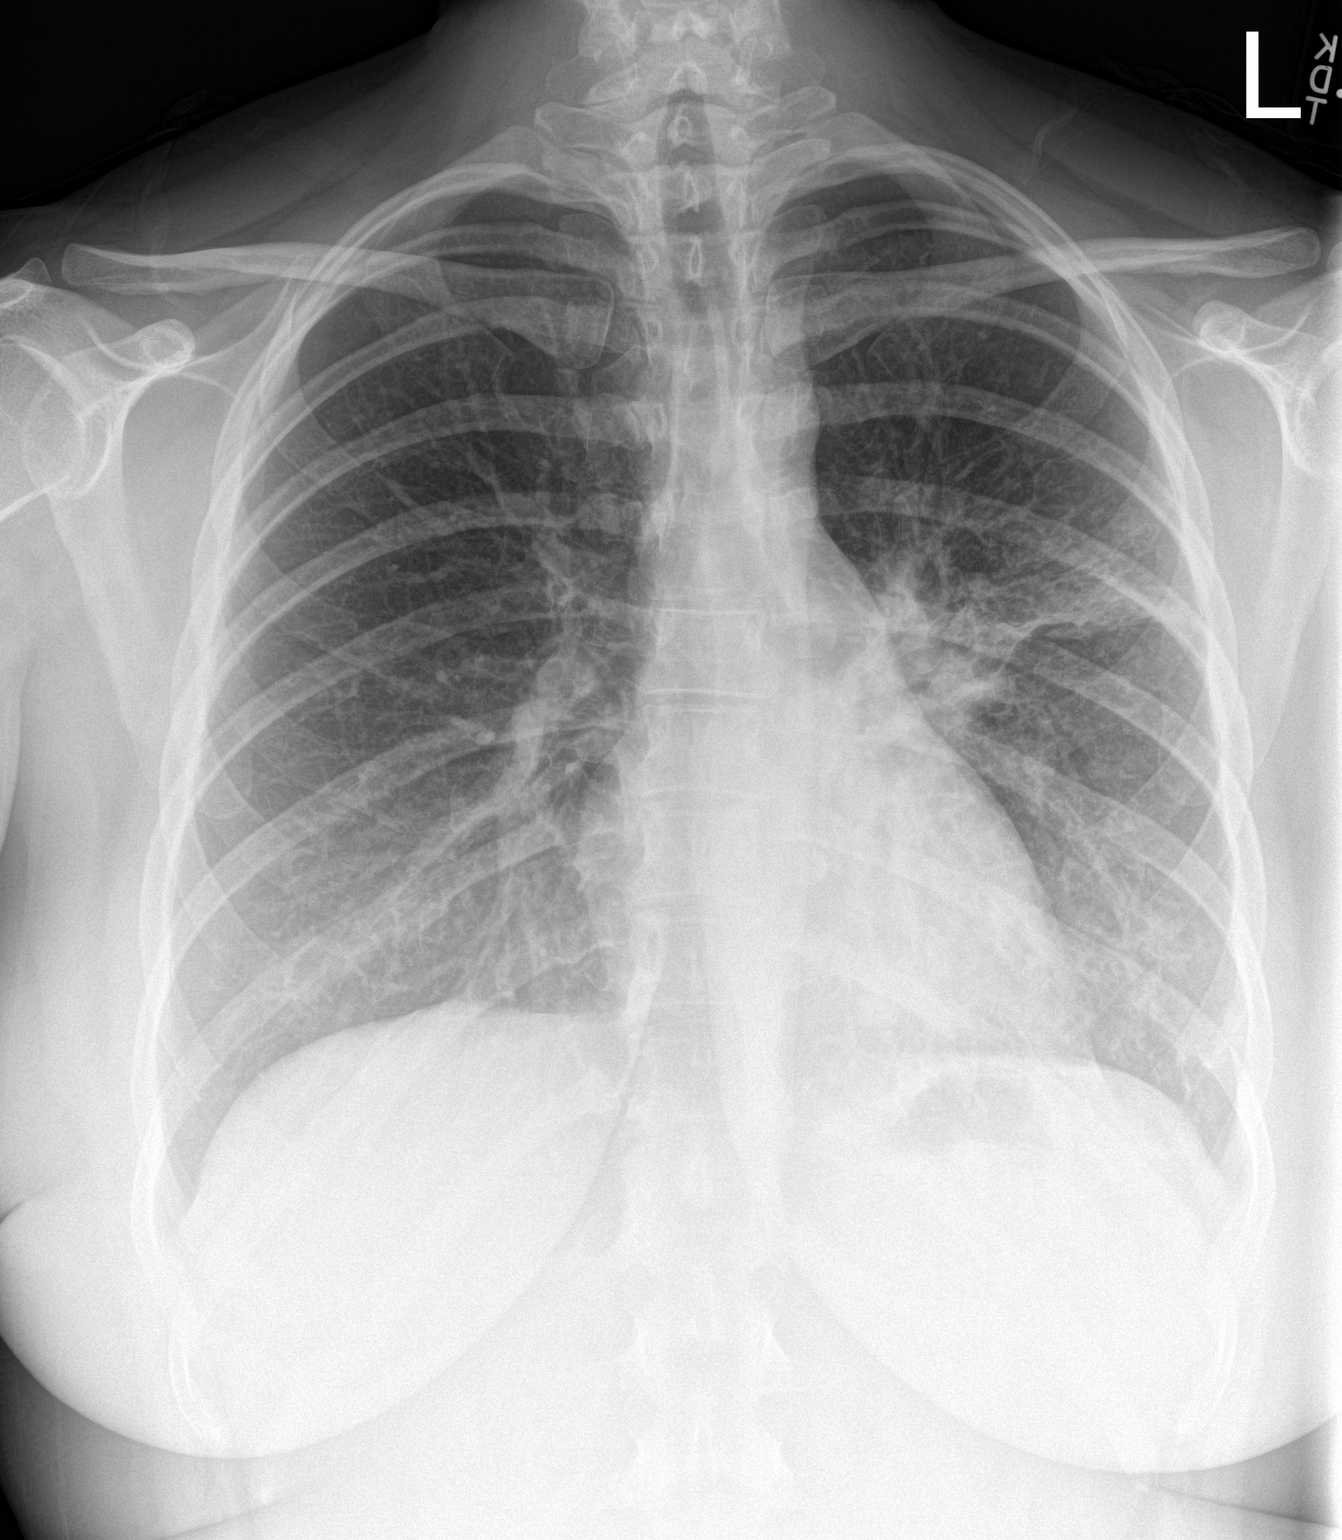

[chest lat]
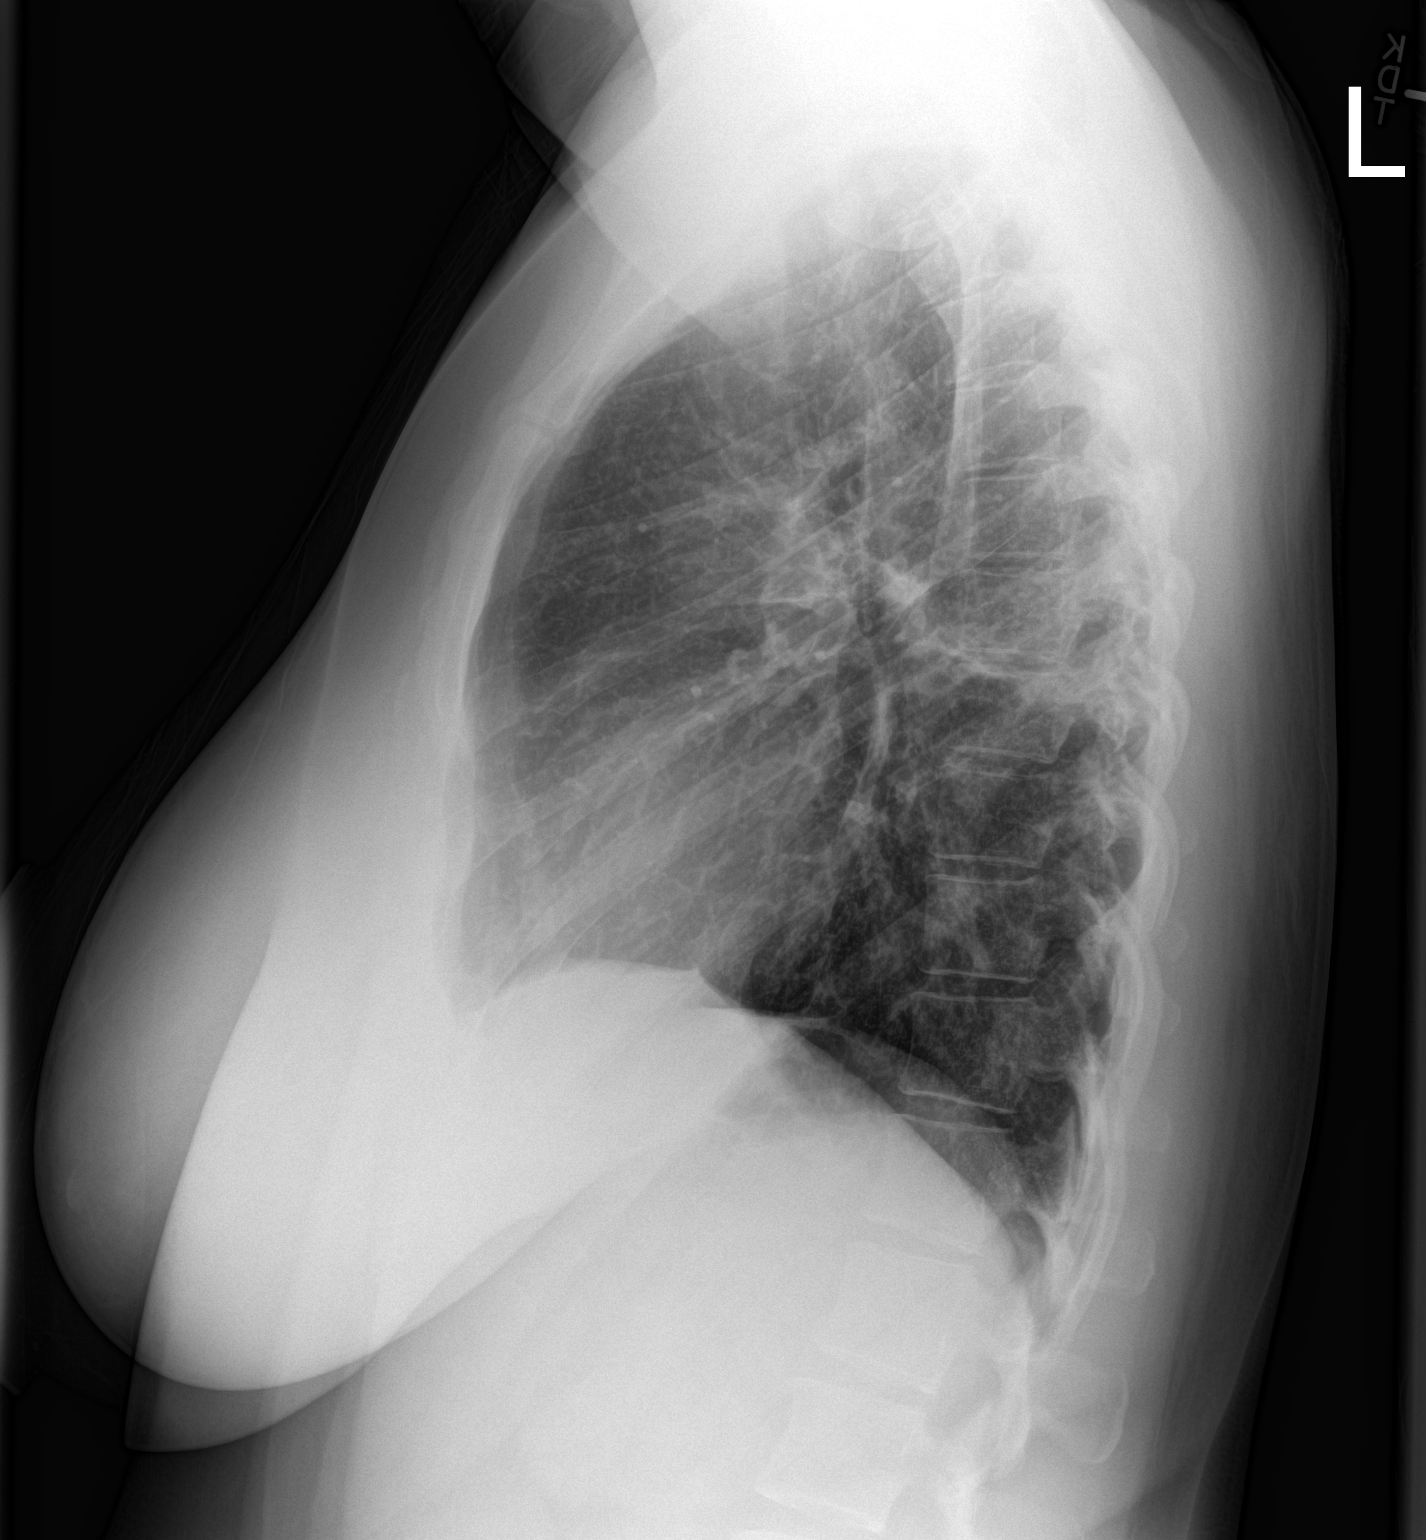

[2 of 2 positions shown; findings below may reference images not displayed]

FINDINGS: There is left perihilar airspace disease most concerning for
pneumonia. There is no pleural effusion or pneumothorax. The heart
and mediastinal contours are unremarkable.

The osseous structures are unremarkable.
IMPRESSION: Left perihilar pneumonia.

## 2016-08-30 ENCOUNTER — Encounter: Payer: Self-pay | Admitting: Gynecology

## 2017-04-03 ENCOUNTER — Other Ambulatory Visit: Payer: Self-pay

## 2017-04-03 MED ORDER — DROSPIRENONE-ETHINYL ESTRADIOL 3-0.03 MG PO TABS: ORAL_TABLET | ORAL | 0 refills | Status: DC

## 2017-05-04 ENCOUNTER — Encounter: Payer: BC Managed Care – PPO | Admitting: Obstetrics & Gynecology

## 2017-06-08 ENCOUNTER — Ambulatory Visit: Payer: BC Managed Care – PPO | Admitting: Obstetrics & Gynecology

## 2017-06-08 ENCOUNTER — Encounter: Payer: Self-pay | Admitting: Obstetrics & Gynecology

## 2017-06-08 VITALS — BP 142/84 | Ht 62.0 in | Wt 219.0 lb

## 2017-06-08 DIAGNOSIS — Z3041 Encounter for surveillance of contraceptive pills: Secondary | ICD-10-CM | POA: Diagnosis not present

## 2017-06-08 DIAGNOSIS — Z01419 Encounter for gynecological examination (general) (routine) without abnormal findings: Secondary | ICD-10-CM | POA: Diagnosis not present

## 2017-06-08 MED ORDER — DROSPIRENONE-ETHINYL ESTRADIOL 3-0.03 MG PO TABS: ORAL_TABLET | ORAL | 4 refills | Status: DC

## 2017-06-08 NOTE — Progress Notes (Signed)
Shannon Sherman [birth year] [id number]   History:    32 y.o. G0 Single  RP:  Established patient presenting for annual gyn exam   HPI: Well on Ocella continuous use x 3 packs because of dysmenorrhea.  No BTB.  No pelvic pain.  No pain with IC. Strict condom use.  Normal vaginal secretions.  Urine/BMs wnl.  Breasts wnl.  BMI 40.06.  Health labs with Fam MD.  Past medical history,surgical history, family history and social history were all reviewed and documented in the EPIC chart.  Gynecologic History Patient's last menstrual period was 05/14/2017. Contraception: condoms and OCP (estrogen/progesterone) Last Pap: 02/2015. Results were: Negative Last mammogram: Never Bone Density: Never Colonoscopy: Never  Obstetric History OB History  Gravida Para Term Preterm AB Living  0            SAB TAB Ectopic Multiple Live Births                    ROS: A ROS was performed and pertinent positives and negatives are included in the history.  GENERAL: No fevers or chills. HEENT: No change in vision, no earache, sore throat or sinus congestion. NECK: No pain or stiffness. CARDIOVASCULAR: No chest pain or pressure. No palpitations. PULMONARY: No shortness of breath, cough or wheeze. GASTROINTESTINAL: No abdominal pain, nausea, vomiting or diarrhea, melena or bright red blood per rectum. GENITOURINARY: No urinary frequency, urgency, hesitancy or dysuria. MUSCULOSKELETAL: No joint or muscle pain, no back pain, no recent trauma. DERMATOLOGIC: No rash, no itching, no lesions. ENDOCRINE: No polyuria, polydipsia, no heat or cold intolerance. No recent change in weight. HEMATOLOGICAL: No anemia or easy bruising or bleeding. NEUROLOGIC: No headache, seizures, numbness, tingling or weakness. PSYCHIATRIC: No depression, no loss of interest in normal activity or change in sleep pattern.     Exam:   BP (!) 142/84 (BP Location: Right Leg, Patient Position: Sitting, Cuff Size: Large)   Ht 5\' 2"  (1.575 m)    Wt 219 lb (99.3 kg)   LMP 05/14/2017   BMI 40.06 kg/m   Body mass index is 40.06 kg/m.  General appearance : Well developed well nourished female. No acute distress HEENT: Eyes: no retinal hemorrhage or exudates,  Neck supple, trachea midline, no carotid bruits, no thyroidmegaly Lungs: Clear to auscultation, no rhonchi or wheezes, or rib retractions  Heart: Regular rate and rhythm, no murmurs or gallops Breast:Examined in sitting and supine position were symmetrical in appearance, no palpable masses or tenderness,  no skin retraction, no nipple inversion, no nipple discharge, no skin discoloration, no axillary or supraclavicular lymphadenopathy Abdomen: no palpable masses or tenderness, no rebound or guarding Extremities: no edema or skin discoloration or tenderness  Pelvic: Vulva: Normal             Vagina: No gross lesions or discharge  Cervix: No gross lesions or discharge.  Pap/HR HPV done  Uterus  AV, normal size, shape and consistency, non-tender and mobile  Adnexa  Without masses or tenderness  Anus: Normal   Assessment/Plan:  32 y.o. female for annual exam   1. Encounter for routine gynecological examination with Papanicolaou smear of cervix Normal gynecologic exam.  Pap with high-risk HPV done.  Breast exam normal.  Health labs with family physician.  Recommend increased physical activity with aerobic activities 5 times a week and weightlifting every 2 days.  Low carb/low calorie diet recommended, for example [employer] diet.  2. Encounter for surveillance of contraceptive pills Ocella continuous  use with withdrawal every 3 packs represcribed.  No contraindication to birth control pills.  Patient also using condoms.  Other orders - drospirenone-ethinyl estradiol (OCELLA) 3-0.03 MG tablet; Take one active pill qd. At end of q 3rd pack be off pill 7 days for withdrawal "period".  [name], 3:15 PM 06/08/2017

## 2017-06-08 NOTE — Patient Instructions (Signed)
1. Encounter for routine gynecological examination with Papanicolaou smear of cervix Normal gynecologic exam.  Pap with high-risk HPV done.  Breast exam normal.  Health labs with family physician.  Recommend increased physical activity with aerobic activities 5 times a week and weightlifting every 2 days.  Low carb/low calorie diet recommended, for example [employer].  2. Encounter for surveillance of contraceptive pills Ocella continuous use with withdrawal every 3 packs represcribed.  No contraindication to birth control pills.  Patient also using condoms.  Other orders - drospirenone-ethinyl estradiol (OCELLA) 3-0.03 MG tablet; Take one active pill qd. At end of q 3rd pack be off pill 7 days for withdrawal "period".  Shannon Sherman, it was a pleasure meeting you today!  I will inform you of your results as soon as they are available.  Health Maintenance, Female Adopting a healthy lifestyle and getting preventive care can go a long way to promote health and wellness. Talk with your health care provider about what schedule of regular examinations is right for you. This is a good chance for you to check in with your provider about disease prevention and staying healthy. In between checkups, there are plenty of things you can do on your own. Experts have done a lot of research about which lifestyle changes and preventive measures are most likely to keep you healthy. Ask your health care provider for more information. Weight and diet Eat a healthy diet  Be sure to include plenty of vegetables, fruits, low-fat dairy products, and lean protein.  Do not eat a lot of foods high in solid fats, added sugars, or salt.  Get regular exercise. This is one of the most important things you can do for your health. ? Most adults should exercise for at least 150 minutes each week. The exercise should increase your heart rate and make you sweat (moderate-intensity exercise). ? Most adults should also do  strengthening exercises at least twice a week. This is in addition to the moderate-intensity exercise.  Maintain a healthy weight  Body mass index (BMI) is a measurement that can be used to identify possible weight problems. It estimates body fat based on height and weight. Your health care provider can help determine your BMI and help you achieve or maintain a healthy weight.  For females 66 years of age and older: ? A BMI below 18.5 is considered underweight. ? A BMI of 18.5 to 24.9 is normal. ? A BMI of 25 to 29.9 is considered overweight. ? A BMI of 30 and above is considered obese.  Watch levels of cholesterol and blood lipids  You should start having your blood tested for lipids and cholesterol at 31 years of age, then have this test every 5 years.  You may need to have your cholesterol levels checked more often if: ? Your lipid or cholesterol levels are high. ? You are older than 31 years of age. ? You are at high risk for heart disease.  Cancer screening Lung Cancer  Lung cancer screening is recommended for adults 33-87 years old who are at high risk for lung cancer because of a history of smoking.  A yearly low-dose CT scan of the lungs is recommended for people who: ? Currently smoke. ? Have quit within the past 15 years. ? Have at least a 30-pack-year history of smoking. A pack year is smoking an average of one pack of cigarettes a day for 1 year.  Yearly screening should continue until it has been 15 years  since you quit.  Yearly screening should stop if you develop a health problem that would prevent you from having lung cancer treatment.  Breast Cancer  Practice breast self-awareness. This means understanding how your breasts normally appear and feel.  It also means doing regular breast self-exams. Let your health care provider know about any changes, no matter how small.  If you are in your 20s or 30s, you should have a clinical breast exam (CBE) by a health  care provider every 1-3 years as part of a regular health exam.  If you are 26 or older, have a CBE every year. Also consider having a breast X-ray (mammogram) every year.  If you have a family history of breast cancer, talk to your health care provider about genetic screening.  If you are at high risk for breast cancer, talk to your health care provider about having an MRI and a mammogram every year.  Breast cancer gene (BRCA) assessment is recommended for women who have family members with BRCA-related cancers. BRCA-related cancers include: ? Breast. ? Ovarian. ? Tubal. ? Peritoneal cancers.  Results of the assessment will determine the need for genetic counseling and BRCA1 and BRCA2 testing.  Cervical Cancer Your health care provider may recommend that you be screened regularly for cancer of the pelvic organs (ovaries, uterus, and vagina). This screening involves a pelvic examination, including checking for microscopic changes to the surface of your cervix (Pap test). You may be encouraged to have this screening done every 3 years, beginning at age 89.  For women ages 76-65, health care providers may recommend pelvic exams and Pap testing every 3 years, or they may recommend the Pap and pelvic exam, combined with testing for human papilloma virus (HPV), every 5 years. Some types of HPV increase your risk of cervical cancer. Testing for HPV may also be done on women of any age with unclear Pap test results.  Other health care providers may not recommend any screening for nonpregnant women who are considered low risk for pelvic cancer and who do not have symptoms. Ask your health care provider if a screening pelvic exam is right for you.  If you have had past treatment for cervical cancer or a condition that could lead to cancer, you need Pap tests and screening for cancer for at least 20 years after your treatment. If Pap tests have been discontinued, your risk factors (such as having a new  sexual partner) need to be reassessed to determine if screening should resume. Some women have medical problems that increase the chance of getting cervical cancer. In these cases, your health care provider may recommend more frequent screening and Pap tests.  Colorectal Cancer  This type of cancer can be detected and often prevented.  Routine colorectal cancer screening usually begins at 31 years of age and continues through 31 years of age.  Your health care provider may recommend screening at an earlier age if you have risk factors for colon cancer.  Your health care provider may also recommend using home test kits to check for hidden blood in the stool.  A small camera at the end of a tube can be used to examine your colon directly (sigmoidoscopy or colonoscopy). This is done to check for the earliest forms of colorectal cancer.  Routine screening usually begins at age 86.  Direct examination of the colon should be repeated every 5-10 years through 31 years of age. However, you may need to be screened more often if  early forms of precancerous polyps or small growths are found.  Skin Cancer  Check your skin from head to toe regularly.  Tell your health care provider about any new moles or changes in moles, especially if there is a change in a mole's shape or color.  Also tell your health care provider if you have a mole that is larger than the size of a pencil eraser.  Always use sunscreen. Apply sunscreen liberally and repeatedly throughout the day.  Protect yourself by wearing long sleeves, pants, a wide-brimmed hat, and sunglasses whenever you are outside.  Heart disease, diabetes, and high blood pressure  High blood pressure causes heart disease and increases the risk of stroke. High blood pressure is more likely to develop in: ? People who have blood pressure in the high end of the normal range (130-139/85-89 mm Hg). ? People who are overweight or obese. ? People who are  African American.  If you are 57-66 years of age, have your blood pressure checked every 3-5 years. If you are 57 years of age or older, have your blood pressure checked every year. You should have your blood pressure measured twice-once when you are at a hospital or clinic, and once when you are not at a hospital or clinic. Record the average of the two measurements. To check your blood pressure when you are not at a hospital or clinic, you can use: ? An automated blood pressure machine at a pharmacy. ? A home blood pressure monitor.  If you are between 89 years and 40 years old, ask your health care provider if you should take aspirin to prevent strokes.  Have regular diabetes screenings. This involves taking a blood sample to check your fasting blood sugar level. ? If you are at a normal weight and have a low risk for diabetes, have this test once every three years after 31 years of age. ? If you are overweight and have a high risk for diabetes, consider being tested at a younger age or more often. Preventing infection Hepatitis B  If you have a higher risk for hepatitis B, you should be screened for this virus. You are considered at high risk for hepatitis B if: ? You were born in a country where hepatitis B is common. Ask your health care provider which countries are considered high risk. ? Your parents were born in a high-risk country, and you have not been immunized against hepatitis B (hepatitis B vaccine). ? You have HIV or AIDS. ? You use needles to inject street drugs. ? You live with someone who has hepatitis B. ? You have had sex with someone who has hepatitis B. ? You get hemodialysis treatment. ? You take certain medicines for conditions, including cancer, organ transplantation, and autoimmune conditions.  Hepatitis C  Blood testing is recommended for: ? Everyone born from [birth year] through 1964. ? Anyone with known risk factors for hepatitis C.  Sexually transmitted  infections (STIs)  You should be screened for sexually transmitted infections (STIs) including gonorrhea and chlamydia if: ? You are sexually active and are younger than 31 years of age. ? You are older than 32 years of age and your health care provider tells you that you are at risk for this type of infection. ? Your sexual activity has changed since you were last screened and you are at an increased risk for chlamydia or gonorrhea. Ask your health care provider if you are at risk.  If you do not have  HIV, but are at risk, it may be recommended that you take a prescription medicine daily to prevent HIV infection. This is called pre-exposure prophylaxis (PrEP). You are considered at risk if: ? You are sexually active and do not regularly use condoms or know the HIV status of your partner(s). ? You take drugs by injection. ? You are sexually active with a partner who has HIV.  Talk with your health care provider about whether you are at high risk of being infected with HIV. If you choose to begin PrEP, you should first be tested for HIV. You should then be tested every 3 months for as long as you are taking PrEP. Pregnancy  If you are premenopausal and you may become pregnant, ask your health care provider about preconception counseling.  If you may become pregnant, take 400 to 800 micrograms (mcg) of folic acid every day.  If you want to prevent pregnancy, talk to your health care provider about birth control (contraception). Osteoporosis and menopause  Osteoporosis is a disease in which the bones lose minerals and strength with aging. This can result in serious bone fractures. Your risk for osteoporosis can be identified using a bone density scan.  If you are 75 years of age or older, or if you are at risk for osteoporosis and fractures, ask your health care provider if you should be screened.  Ask your health care provider whether you should take a calcium or vitamin D supplement to lower  your risk for osteoporosis.  Menopause may have certain physical symptoms and risks.  Hormone replacement therapy may reduce some of these symptoms and risks. Talk to your health care provider about whether hormone replacement therapy is right for you. Follow these instructions at home:  Schedule regular health, dental, and eye exams.  Stay current with your immunizations.  Do not use any tobacco products including cigarettes, chewing tobacco, or electronic cigarettes.  If you are pregnant, do not drink alcohol.  If you are breastfeeding, limit how much and how often you drink alcohol.  Limit alcohol intake to no more than 1 drink per day for nonpregnant women. One drink equals 12 ounces of beer, 5 ounces of wine, or 1 ounces of hard liquor.  Do not use street drugs.  Do not share needles.  Ask your health care provider for help if you need support or information about quitting drugs.  Tell your health care provider if you often feel depressed.  Tell your health care provider if you have ever been abused or do not feel safe at home. This information is not intended to replace advice given to you by your health care provider. Make sure you discuss any questions you have with your health care provider. Document Released: 10/17/2010 Document Revised: 09/09/2015 Document Reviewed: 01/05/2015 Elsevier Interactive Patient Education  [employer].

## 2017-06-13 LAB — PAP, TP IMAGING W/ HPV RNA, RFLX HPV TYPE 16,18/45: HPV DNA High Risk: NOT DETECTED

## 2017-06-15 ENCOUNTER — Encounter: Payer: Self-pay | Admitting: *Deleted

## 2018-06-27 ENCOUNTER — Other Ambulatory Visit: Payer: Self-pay | Admitting: Obstetrics & Gynecology

## 2018-08-12 ENCOUNTER — Other Ambulatory Visit: Payer: Self-pay

## 2018-08-12 ENCOUNTER — Telehealth: Payer: Self-pay | Admitting: *Deleted

## 2018-08-12 ENCOUNTER — Ambulatory Visit: Payer: BC Managed Care – PPO | Admitting: Obstetrics & Gynecology

## 2018-08-12 ENCOUNTER — Encounter: Payer: Self-pay | Admitting: Obstetrics & Gynecology

## 2018-08-12 ENCOUNTER — Ambulatory Visit (HOSPITAL_COMMUNITY)
Admission: RE | Admit: 2018-08-12 | Discharge: 2018-08-12 | Disposition: A | Discharge status: Home / Self Care | Payer: BC Managed Care – PPO | Source: Ambulatory Visit | Attending: Obstetrics & Gynecology | Admitting: Obstetrics & Gynecology

## 2018-08-12 VITALS — BP 128/86 | Ht 62.0 in | Wt 228.0 lb

## 2018-08-12 DIAGNOSIS — Z01419 Encounter for gynecological examination (general) (routine) without abnormal findings: Secondary | ICD-10-CM | POA: Diagnosis not present

## 2018-08-12 DIAGNOSIS — Z6841 Body Mass Index (BMI) 40.0 and over, adult: Secondary | ICD-10-CM

## 2018-08-12 DIAGNOSIS — N945 Secondary dysmenorrhea: Secondary | ICD-10-CM | POA: Diagnosis not present

## 2018-08-12 DIAGNOSIS — M79662 Pain in left lower leg: Secondary | ICD-10-CM

## 2018-08-12 DIAGNOSIS — E66813 Obesity, class 3: Secondary | ICD-10-CM

## 2018-08-12 NOTE — Progress Notes (Signed)
VASCULAR LAB PRELIMINARY  PRELIMINARY  PRELIMINARY  PRELIMINARY  Left lower extremity venous duplex completed.    Preliminary report:  See CV proc for preliminary results.  Meshawn Oconnor, RVT 08/12/2018, 1:12 PM

## 2018-08-12 NOTE — Telephone Encounter (Signed)
-----   Message from Genia Del, MD sent at 08/12/2018 11:28 AM EDT ----- Regarding: Schedule left lower limb Doppler Left leg pain at calf x 2 weeks.  R/O left leg DVT.

## 2018-08-12 NOTE — Progress Notes (Signed)
Shannon Sherman 12-08-1985 [id number]   History:    33 y.o. G0 Single.  [employer], broadcasting/film/videoteacher.  RP:  Established patient presenting for annual gyn exam   HPI: Well on Ocella continuously x 3 months at a time for dysmenorrhea.  No BTB.  No pelvic pain.  Abstinent.  Urine/BMs normal.  Breasts normal.  BMI 41.70.  C/O left leg pain at the calf x 2 weeks.  Not swollen, no redness, no heat.  No joint pain.  No skin rash.  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  No trauma. No impression of having muscle cramps.  Not helped by elevation, cold/hot applications.  Worse when walking.    Past medical history,surgical history, family history and social history were all reviewed and documented in the EPIC chart.  Gynecologic History Patient's last menstrual period was 07/12/2018. Contraception: abstinence and OCP (estrogen/progesterone) Last Pap: 05/2017. Results were:  Negative/HPV HR neg Last mammogram: Never Bone Density: Never Colonoscopy: Never  Obstetric History OB History  Gravida Para Term Preterm AB Living  0            SAB TAB Ectopic Multiple Live Births                ROS: A ROS was performed and pertinent positives and negatives are included in the history.  GENERAL: No fevers or chills. HEENT: No change in vision, no earache, sore throat or sinus congestion. NECK: No pain or stiffness. CARDIOVASCULAR: No chest pain or pressure. No palpitations. PULMONARY: No shortness of breath, cough or wheeze. GASTROINTESTINAL: No abdominal pain, nausea, vomiting or diarrhea, melena or bright red blood per rectum. GENITOURINARY: No urinary frequency, urgency, hesitancy or dysuria. MUSCULOSKELETAL: No joint or muscle pain, no back pain, no recent trauma. DERMATOLOGIC: No rash, no itching, no lesions. ENDOCRINE: No polyuria, polydipsia, no heat or cold intolerance. No recent change in weight. HEMATOLOGICAL: No anemia or easy bruising or bleeding. NEUROLOGIC: No headache, seizures, numbness,  tingling or weakness. PSYCHIATRIC: No depression, no loss of interest in normal activity or change in sleep pattern.     Exam:   BP 128/86   Ht 5\' 2"  (1.575 m)   Wt 228 lb (103.4 kg)   LMP 07/12/2018 Comment: pill  BMI 41.70 kg/m   Body mass index is 41.7 kg/m.  General appearance : Well developed well nourished female. No acute distress HEENT: Eyes: no retinal hemorrhage or exudates,  Neck supple, trachea midline, no carotid bruits, no thyroidmegaly Lungs: Clear to auscultation, no rhonchi or wheezes, or rib retractions  Heart: Regular rate and rhythm, no murmurs or gallops Breast:Examined in sitting and supine position were symmetrical in appearance, no palpable masses or tenderness,  no skin retraction, no nipple inversion, no nipple discharge, no skin discoloration, no axillary or supraclavicular lymphadenopathy Abdomen: no palpable masses or tenderness, no rebound or guarding Extremities: no edema or skin discoloration or tenderness.  Right leg completely normal, no erythema, no increase warmth (patient c/o Rt calf pain x 2 weeks).  Pelvic: Vulva: Normal             Vagina: No gross lesions or discharge  Cervix: No gross lesions or discharge.  Pap reflex done  Uterus  AV, normal size, shape and consistency, non-tender and mobile  Adnexa  Without masses or tenderness  Anus: Normal   Assessment/Plan:  33 y.o. female for annual exam   1. Encounter for routine gynecological examination with Papanicolaou smear of cervix Normal gynecologic exam.  Pap reflex done.  Breast exam normal.  Health labs with family physician.  2. Secondary dysmenorrhea Controled on Ocella continuous use x 3 months at a time.  No CI to continue if Left leg Doppler is negative.  Prescription sent to pharmacy.  3. Pain of left calf Probable muscle pain at left calf.  Normal left leg exam.  No oedema, no erythema, no increase in warmth.  Will still investigate with a left limb Doppler to r/o a DVT.   Continue same BCPs if Doppler is negative.  Recommend referral to Rheumatologist through her Fam MD if the pain persists.  4. Class 3 severe obesity due to excess calories without serious comorbidity with body mass index (BMI) of 40.0 to 44.9 in adult [facility]) Recommend a lower calorie/carb diet such as [employer].  Recommend aerobic physical activities 5 times a week and weightlifting every 2 days.  Counseling on above issues and coordination of care more than 50% for 10 minutes.  [name], 11:09 AM 08/12/2018

## 2018-08-12 NOTE — Telephone Encounter (Signed)
Order placed at [facility], patient scheduled today at 1:00pm for the below

## 2018-08-12 NOTE — Telephone Encounter (Signed)
Candace called back from Vascular lab and said negative for DVT.

## 2018-08-13 ENCOUNTER — Encounter: Payer: Self-pay | Admitting: Obstetrics & Gynecology

## 2018-08-13 ENCOUNTER — Encounter: Payer: BC Managed Care – PPO | Admitting: Obstetrics & Gynecology

## 2018-08-13 LAB — PAP IG W/ RFLX HPV ASCU

## 2018-08-13 NOTE — Patient Instructions (Addendum)
1. Encounter for routine gynecological examination with Papanicolaou smear of cervix Normal gynecologic exam.  Pap reflex done.  Breast exam normal.  Health labs with family physician.  2. Secondary dysmenorrhea Controled on Ocella continuous use x 3 months at a time.  No CI to continue if Left leg Doppler is negative.  Prescription sent to pharmacy.  3. Pain of left calf Probable muscle pain at left calf.  Normal left leg exam.  No oedema, no erythema, no increase in warmth.  Will still investigate with a left limb Doppler to r/o a DVT.  Continue same BCPs if Doppler is negative.  Recommend referral to Rheumatologist through her Fam MD if the pain persists.  4. Class 3 severe obesity due to excess calories without serious comorbidity with body mass index (BMI) of 40.0 to 44.9 in adult [facility]) Recommend a lower calorie/carb diet such as [employer].  Recommend aerobic physical activities 5 times a week and weightlifting every 2 days.  Shannon Sherman, it was a pleasure seeing you today!  I will inform you of your results as soon as they are available.

## 2018-09-12 ENCOUNTER — Other Ambulatory Visit: Payer: Self-pay | Admitting: Obstetrics & Gynecology

## 2019-06-15 ENCOUNTER — Ambulatory Visit: Payer: BC Managed Care – PPO | Attending: Internal Medicine

## 2019-06-15 DIAGNOSIS — Z23 Encounter for immunization: Secondary | ICD-10-CM | POA: Insufficient documentation

## 2019-06-15 NOTE — Progress Notes (Signed)
   Covid-19 Vaccination Clinic  Name:  Shannon Sherman    MRN: [id number] DOB: Jul 26, 1985  06/15/2019  Shannon Sherman was observed post Covid-19 immunization for 15 minutes without incidence. She was provided with Vaccine Information Sheet and instruction to access the V-Safe system.   Shannon Sherman was instructed to call 911 with any severe reactions post vaccine: Marland Kitchen Difficulty breathing  . Swelling of your face and throat  . A fast heartbeat  . A bad rash all over your body  . Dizziness and weakness    Immunizations Administered    Name Date Dose VIS Date Route   Pfizer COVID-19 Vaccine 06/15/2019  9:48 AM 0.3 mL 03/28/2019 Intramuscular   Manufacturer: [employer], [employer]   Lot: [id number]   NDC: [address]-4

## 2019-07-07 ENCOUNTER — Ambulatory Visit: Payer: BC Managed Care – PPO | Attending: Internal Medicine

## 2019-07-07 DIAGNOSIS — Z23 Encounter for immunization: Secondary | ICD-10-CM

## 2019-07-07 NOTE — Progress Notes (Signed)
   Covid-19 Vaccination Clinic  Name:  Regene Mccarthy    MRN: [id number] DOB: 02-19-1986  07/07/2019  Ms. Kiene was observed post Covid-19 immunization for 15 minutes without incident. She was provided with Vaccine Information Sheet and instruction to access the V-Safe system.   Ms. Sima was instructed to call 911 with any severe reactions post vaccine: Marland Kitchen Difficulty breathing  . Swelling of face and throat  . A fast heartbeat  . A bad rash all over body  . Dizziness and weakness   Immunizations Administered    Name Date Dose VIS Date Route   Pfizer COVID-19 Vaccine 07/07/2019  9:54 AM 0.3 mL 03/28/2019 Intramuscular   Manufacturer: [employer], [employer]   Lot: [id number]   NDC: [address]-4

## 2019-07-16 ENCOUNTER — Other Ambulatory Visit: Payer: Self-pay | Admitting: Obstetrics & Gynecology

## 2019-07-16 NOTE — Telephone Encounter (Signed)
annual exam on 09/24/19

## 2019-09-24 ENCOUNTER — Encounter: Payer: BC Managed Care – PPO | Admitting: Obstetrics & Gynecology

## 2019-09-25 ENCOUNTER — Other Ambulatory Visit: Payer: Self-pay | Admitting: Obstetrics & Gynecology

## 2019-11-21 ENCOUNTER — Encounter: Payer: BC Managed Care – PPO | Admitting: Obstetrics & Gynecology

## 2019-11-28 ENCOUNTER — Encounter: Payer: Self-pay | Admitting: Obstetrics & Gynecology

## 2019-11-28 ENCOUNTER — Other Ambulatory Visit: Payer: Self-pay

## 2019-11-28 ENCOUNTER — Ambulatory Visit (INDEPENDENT_AMBULATORY_CARE_PROVIDER_SITE_OTHER): Payer: BC Managed Care – PPO | Admitting: Obstetrics & Gynecology

## 2019-11-28 VITALS — BP 140/92 | Ht 62.0 in | Wt 206.0 lb

## 2019-11-28 DIAGNOSIS — Z01419 Encounter for gynecological examination (general) (routine) without abnormal findings: Secondary | ICD-10-CM | POA: Diagnosis not present

## 2019-11-28 DIAGNOSIS — Z6837 Body mass index (BMI) 37.0-37.9, adult: Secondary | ICD-10-CM

## 2019-11-28 DIAGNOSIS — I10 Essential (primary) hypertension: Secondary | ICD-10-CM

## 2019-11-28 DIAGNOSIS — N945 Secondary dysmenorrhea: Secondary | ICD-10-CM | POA: Diagnosis not present

## 2019-11-28 DIAGNOSIS — E6609 Other obesity due to excess calories: Secondary | ICD-10-CM

## 2019-11-28 LAB — LIPID PANEL
Cholesterol: 228 mg/dL — ABNORMAL HIGH (ref <200)
HDL: 94 mg/dL (ref >=50)
LDL Cholesterol (Calc): 113 mg/dL (calc) — ABNORMAL HIGH
Non-HDL Cholesterol (Calc): 134 mg/dL (calc) — ABNORMAL HIGH (ref <130)
Total CHOL/HDL Ratio: 2.4 (calc) (ref <5.0)
Triglycerides: 105 mg/dL (ref <150)

## 2019-11-28 LAB — COMPREHENSIVE METABOLIC PANEL
AG Ratio: 1.6 (calc) (ref 1.0–2.5)
ALT: 17 U/L (ref 6–29)
AST: 18 U/L (ref 10–30)
Albumin: 4.1 g/dL (ref 3.6–5.1)
Alkaline phosphatase (APISO): 41 U/L (ref 31–125)
BUN: 12 mg/dL (ref 7–25)
CO2: 23 mmol/L (ref 20–32)
Calcium: 9.3 mg/dL (ref 8.6–10.2)
Chloride: 103 mmol/L (ref 98–110)
Creat: 0.83 mg/dL (ref 0.50–1.10)
Globulin: 2.5 g/dL (calc) (ref 1.9–3.7)
Glucose, Bld: 95 mg/dL (ref 65–99)
Potassium: 4.2 mmol/L (ref 3.5–5.3)
Sodium: 137 mmol/L (ref 135–146)
Total Bilirubin: 0.3 mg/dL (ref 0.2–1.2)
Total Protein: 6.6 g/dL (ref 6.1–8.1)

## 2019-11-28 LAB — CBC
HCT: 39 % (ref 35.0–45.0)
Hemoglobin: 13.2 g/dL (ref 11.7–15.5)
MCH: 31.1 pg (ref 27.0–33.0)
MCHC: 33.8 g/dL (ref 32.0–36.0)
MCV: 92 fL (ref 80.0–100.0)
MPV: 10.7 fL (ref 7.5–12.5)
Platelets: 274 10*3/uL (ref 140–400)
RBC: 4.24 10*6/uL (ref 3.80–5.10)
RDW: 12.6 % (ref 11.0–15.0)
WBC: 8.2 10*3/uL (ref 3.8–10.8)

## 2019-11-28 LAB — VITAMIN D 25 HYDROXY (VIT D DEFICIENCY, FRACTURES): Vit D, 25-Hydroxy: 31 ng/mL (ref 30–100)

## 2019-11-28 LAB — TSH: TSH: 2.59 m[IU]/L

## 2019-11-28 MED ORDER — DROSPIRENONE-ETHINYL ESTRADIOL 3-0.03 MG PO TABS: 1.0000 | ORAL_TABLET | Freq: Every day | ORAL | 5 refills | Status: DC

## 2019-11-28 NOTE — Progress Notes (Signed)
Shannon Sherman June 19, 1985 [id number]   History:    34 y.o. G0 Single.  Kindergarten Pharmacist, hospital.  RP:  Established patient presenting for annual gyn exam   HPI: Well on Ocella continuously x 3 months at a time for dysmenorrhea.  No BTB.  No pelvic pain.  Abstinent.  Urine/BMs normal.  Breasts normal.  BMI 37.68.  Fasting health labs here today.     Past medical history,surgical history, family history and social history were all reviewed and documented in the EPIC chart.  Gynecologic History Patient's last menstrual period was 08/28/2019.  Obstetric History OB History  Gravida Para Term Preterm AB Living  0            SAB TAB Ectopic Multiple Live Births                ROS: A ROS was performed and pertinent positives and negatives are included in the history.  GENERAL: No fevers or chills. HEENT: No change in vision, no earache, sore throat or sinus congestion. NECK: No pain or stiffness. CARDIOVASCULAR: No chest pain or pressure. No palpitations. PULMONARY: No shortness of breath, cough or wheeze. GASTROINTESTINAL: No abdominal pain, nausea, vomiting or diarrhea, melena or bright red blood per rectum. GENITOURINARY: No urinary frequency, urgency, hesitancy or dysuria. MUSCULOSKELETAL: No joint or muscle pain, no back pain, no recent trauma. DERMATOLOGIC: No rash, no itching, no lesions. ENDOCRINE: No polyuria, polydipsia, no heat or cold intolerance. No recent change in weight. HEMATOLOGICAL: No anemia or easy bruising or bleeding. NEUROLOGIC: No headache, seizures, numbness, tingling or weakness. PSYCHIATRIC: No depression, no loss of interest in normal activity or change in sleep pattern.     Exam:   BP (!) 140/92   Ht 5' 2" (1.575 m)   Wt 206 lb (93.4 kg)   LMP 08/28/2019 Comment: pill  BMI 37.68 kg/m   Body mass index is 37.68 kg/m.  General appearance : Well developed well nourished female. No acute distress HEENT: Eyes: no retinal hemorrhage or exudates,  Neck  supple, trachea midline, no carotid bruits, no thyroidmegaly Lungs: Clear to auscultation, no rhonchi or wheezes, or rib retractions  Heart: Regular rate and rhythm, no murmurs or gallops Breast:Examined in sitting and supine position were symmetrical in appearance, no palpable masses or tenderness,  no skin retraction, no nipple inversion, no nipple discharge, no skin discoloration, no axillary or supraclavicular lymphadenopathy Abdomen: no palpable masses or tenderness, no rebound or guarding Extremities: no edema or skin discoloration or tenderness  Pelvic: Vulva: Normal             Vagina: No gross lesions or discharge  Cervix: No gross lesions or discharge.  Pap reflex done.  Uterus  AV, normal size, shape and consistency, non-tender and mobile  Adnexa  Without masses or tenderness  Anus: Normal   Assessment/Plan:  34 y.o. female for annual exam   1. Encounter for routine gynecological examination with Papanicolaou smear of cervix Normal gynecologic exam.  Pap reflex done.  Breast exam normal.  Fasting health labs here today. - CBC - Comp Met (CMET) - TSH - Lipid panel - VITAMIN D 25 Hydroxy (Vit-D Deficiency, Fractures)  2. Secondary dysmenorrhea Well-controlled dysmenorrhea on Ocella.  Taking it continuously for 3 months at a time.  Blood pressure is borderline today at 140/92.  Recommend a low-salt diet.  Will recheck blood pressure and inform us if it remains above 140/90.  In that case will manage her blood pressure with her family  PA and we will change her contraception.  3. Hypertension, unspecified type Will reevaluate with multiple readings of her blood pressure at different times of the day.  If remains above 140/90, will consult with her family PA.  Low-salt diet.  We will also inform me and we will change her contraception.  4. Class 2 obesity due to excess calories without serious comorbidity with body mass index (BMI) of 37.0 to 37.9 in adult Recommend a lower  calorie/carb diet.  Aerobic activities 5 times a week and light weightlifting every 2 days.  Other orders - buPROPion (WELLBUTRIN XL) 150 MG 24 hr tablet; Take 150 mg by mouth daily. - drospirenone-ethinyl estradiol (SYEDA) 3-0.03 MG tablet; Take 1 tablet by mouth daily.  Princess Bruins MD, 10:35 AM 11/28/2019

## 2019-11-29 ENCOUNTER — Encounter: Payer: Self-pay | Admitting: Obstetrics & Gynecology

## 2019-12-01 LAB — PAP IG W/ RFLX HPV ASCU

## 2020-11-30 ENCOUNTER — Other Ambulatory Visit: Payer: Self-pay | Admitting: Obstetrics & Gynecology

## 2020-12-01 ENCOUNTER — Other Ambulatory Visit: Payer: Self-pay | Admitting: Obstetrics & Gynecology

## 2020-12-01 NOTE — Telephone Encounter (Signed)
Last AEX 11/28/19 Scheduled AEX 03/09/21

## 2021-02-15 ENCOUNTER — Other Ambulatory Visit: Payer: Self-pay | Admitting: Obstetrics & Gynecology

## 2021-02-15 NOTE — Telephone Encounter (Signed)
Medication refill request: Syeda Last AEX:  11/28/19 Next AEX: 03/09/21 Last MMG (if hormonal medication request): n/a Refill authorized: 02/15/21   Med pend for 28tabs 0RF

## 2021-03-09 ENCOUNTER — Encounter: Payer: Self-pay | Admitting: Obstetrics & Gynecology

## 2021-03-09 ENCOUNTER — Ambulatory Visit (INDEPENDENT_AMBULATORY_CARE_PROVIDER_SITE_OTHER): Payer: BC Managed Care – PPO | Admitting: Obstetrics & Gynecology

## 2021-03-09 ENCOUNTER — Telehealth: Payer: Self-pay

## 2021-03-09 ENCOUNTER — Other Ambulatory Visit: Payer: Self-pay

## 2021-03-09 VITALS — BP 136/84 | Ht 62.0 in | Wt 239.0 lb

## 2021-03-09 DIAGNOSIS — Z6841 Body Mass Index (BMI) 40.0 and over, adult: Secondary | ICD-10-CM | POA: Diagnosis not present

## 2021-03-09 DIAGNOSIS — N945 Secondary dysmenorrhea: Secondary | ICD-10-CM | POA: Diagnosis not present

## 2021-03-09 DIAGNOSIS — Z01419 Encounter for gynecological examination (general) (routine) without abnormal findings: Secondary | ICD-10-CM | POA: Diagnosis not present

## 2021-03-09 MED ORDER — DROSPIRENONE-ETHINYL ESTRADIOL 3-0.03 MG PO TABS
1.0000 | ORAL_TABLET | Freq: Every day | ORAL | 4 refills | Status: DC
Start: 2021-03-09 — End: 2022-03-29

## 2021-03-09 NOTE — Progress Notes (Signed)
Shannon Sherman 11-Jul-1985 [id number]   History:    35 y.o. G0 Single.  Kindergarten Pharmacist, hospital.   RP:  Established patient presenting for annual gyn exam    HPI: Well on Ocella continuously x 3 months at a time for dysmenorrhea.  No BTB.  No pelvic pain.  Abstinent.  Pap 11/2019 Neg.  Urine/BMs normal.  Breasts normal.  BMI increased to 43.71.  F/U here for Fasting health labs.  Past medical history,surgical history, family history and social history were all reviewed and documented in the EPIC chart.  Gynecologic History Patient's last menstrual period was 02/10/2021.  Obstetric History OB History  Gravida Para Term Preterm AB Living  0            SAB IAB Ectopic Multiple Live Births                ROS: A ROS was performed and pertinent positives and negatives are included in the history.  GENERAL: No fevers or chills. HEENT: No change in vision, no earache, sore throat or sinus congestion. NECK: No pain or stiffness. CARDIOVASCULAR: No chest pain or pressure. No palpitations. PULMONARY: No shortness of breath, cough or wheeze. GASTROINTESTINAL: No abdominal pain, nausea, vomiting or diarrhea, melena or bright red blood per rectum. GENITOURINARY: No urinary frequency, urgency, hesitancy or dysuria. MUSCULOSKELETAL: No joint or muscle pain, no back pain, no recent trauma. DERMATOLOGIC: No rash, no itching, no lesions. ENDOCRINE: No polyuria, polydipsia, no heat or cold intolerance. No recent change in weight. HEMATOLOGICAL: No anemia or easy bruising or bleeding. NEUROLOGIC: No headache, seizures, numbness, tingling or weakness. PSYCHIATRIC: No depression, no loss of interest in normal activity or change in sleep pattern.     Exam:   BP 136/84 (Cuff Size: Large)   Ht 5' 2"  (1.575 m)   Wt 239 lb (108.4 kg)   LMP 02/10/2021   BMI 43.71 kg/m   Body mass index is 43.71 kg/m.  General appearance : Well developed well nourished female. No acute distress HEENT: Eyes: no retinal  hemorrhage or exudates,  Neck supple, trachea midline, no carotid bruits, no thyroidmegaly Lungs: Clear to auscultation, no rhonchi or wheezes, or rib retractions  Heart: Regular rate and rhythm, no murmurs or gallops Breast:Examined in sitting and supine position were symmetrical in appearance, no palpable masses or tenderness,  no skin retraction, no nipple inversion, no nipple discharge, no skin discoloration, no axillary or supraclavicular lymphadenopathy Abdomen: no palpable masses or tenderness, no rebound or guarding Extremities: no edema or skin discoloration or tenderness  Pelvic: Vulva: Normal             Vagina: No gross lesions or discharge  Cervix: No gross lesions or discharge  Uterus  AV, normal size, shape and consistency, non-tender and mobile  Adnexa  Without masses or tenderness  Anus: Normal   Assessment/Plan:  35 y.o. female for annual exam   1. Well female exam with routine gynecological exam Well on Ocella continuously x 3 months at a time for dysmenorrhea.  No BTB.  No pelvic pain.  Abstinent.  Pap 11/2019 Neg.  Urine/BMs normal.  Breasts normal.  BMI increased to 43.71.  F/U here for Fasting health labs. - CBC; Future - Comp Met (CMET); Future - TSH; Future - Lipid Profile; Future - Vitamin D 1,25 dihydroxy; Future  2. Secondary dysmenorrhea Well on continuous Ocella x 3 months, then a withdrawal bleeding.  No CI to continue.  Prescription sent to pharmacy.  3. Class  3 severe obesity due to excess calories without serious comorbidity with body mass index (BMI) of 40.0 to 44.9 in adult [facility]) Refer to [facility]/Wt loss [facility].  Increase fitness activities.  Other orders - buPROPion (WELLBUTRIN XL) 300 MG 24 hr tablet; Take 300 mg by mouth daily. - venlafaxine XR (EFFEXOR-XR) 150 MG 24 hr capsule; Take 300 mg by mouth daily. - drospirenone-ethinyl estradiol (SYEDA) 3-0.03 MG tablet; Take 1 tablet by mouth daily. Continuous use   Princess Bruins MD,  3:50 PM 03/09/2021

## 2021-03-09 NOTE — Telephone Encounter (Signed)
Refer to [facility] Nutritionist/Weight loss Center Received: Today Genia Del, MD  [facility] Triage Obesity BMI >40.

## 2021-03-09 NOTE — Telephone Encounter (Signed)
Referral order placed.

## 2021-03-21 NOTE — Telephone Encounter (Signed)
Scheduled 03/21/2022

## 2021-04-04 ENCOUNTER — Other Ambulatory Visit: Payer: Self-pay

## 2021-04-04 ENCOUNTER — Other Ambulatory Visit: Payer: Self-pay | Admitting: *Deleted

## 2021-04-04 ENCOUNTER — Other Ambulatory Visit: Payer: BC Managed Care – PPO

## 2021-04-04 DIAGNOSIS — Z01419 Encounter for gynecological examination (general) (routine) without abnormal findings: Secondary | ICD-10-CM

## 2021-04-04 LAB — VITAMIN D 25 HYDROXY (VIT D DEFICIENCY, FRACTURES): Vit D, 25-Hydroxy: 35 ng/mL (ref 30–100)

## 2021-04-05 LAB — COMPREHENSIVE METABOLIC PANEL
AG Ratio: 1.4 (calc) (ref 1.0–2.5)
ALT: 22 U/L (ref 6–29)
AST: 18 U/L (ref 10–30)
Albumin: 3.9 g/dL (ref 3.6–5.1)
Alkaline phosphatase (APISO): 37 U/L (ref 31–125)
BUN: 15 mg/dL (ref 7–25)
CO2: 20 mmol/L (ref 20–32)
Calcium: 8.8 mg/dL (ref 8.6–10.2)
Chloride: 102 mmol/L (ref 98–110)
Creat: 0.8 mg/dL (ref 0.50–0.97)
Globulin: 2.8 g/dL (calc) (ref 1.9–3.7)
Glucose, Bld: 106 mg/dL — ABNORMAL HIGH (ref 65–99)
Potassium: 3.9 mmol/L (ref 3.5–5.3)
Sodium: 136 mmol/L (ref 135–146)
Total Bilirubin: 0.4 mg/dL (ref 0.2–1.2)
Total Protein: 6.7 g/dL (ref 6.1–8.1)

## 2021-04-05 LAB — LIPID PANEL
Cholesterol: 219 mg/dL — ABNORMAL HIGH (ref <200)
HDL: 103 mg/dL (ref >=50)
LDL Cholesterol (Calc): 88 mg/dL (calc)
Non-HDL Cholesterol (Calc): 116 mg/dL (calc) (ref <130)
Total CHOL/HDL Ratio: 2.1 (calc) (ref <5.0)
Triglycerides: 189 mg/dL — ABNORMAL HIGH (ref <150)

## 2021-04-05 LAB — CBC
HCT: 39.2 % (ref 35.0–45.0)
Hemoglobin: 13.4 g/dL (ref 11.7–15.5)
MCH: 30.9 pg (ref 27.0–33.0)
MCHC: 34.2 g/dL (ref 32.0–36.0)
MCV: 90.5 fL (ref 80.0–100.0)
MPV: 10 fL (ref 7.5–12.5)
Platelets: 275 10*3/uL (ref 140–400)
RBC: 4.33 10*6/uL (ref 3.80–5.10)
RDW: 12.7 % (ref 11.0–15.0)
WBC: 8.6 10*3/uL (ref 3.8–10.8)

## 2021-04-05 LAB — TSH: TSH: 3.05 mIU/L

## 2021-05-11 ENCOUNTER — Encounter: Payer: Self-pay | Admitting: Dietician

## 2021-05-11 ENCOUNTER — Other Ambulatory Visit: Payer: Self-pay

## 2021-05-11 ENCOUNTER — Encounter: Payer: BC Managed Care – PPO | Attending: Obstetrics & Gynecology | Admitting: Dietician

## 2021-05-11 VITALS — Ht 62.0 in | Wt 246.8 lb

## 2021-05-11 DIAGNOSIS — E669 Obesity, unspecified: Secondary | ICD-10-CM | POA: Insufficient documentation

## 2021-05-11 NOTE — Progress Notes (Signed)
Medical Nutrition Therapy  Appointment Start time:  1530  Appointment End time:  1640  Primary concerns today: Lifestyle  Referral diagnosis: E66.01 - Morbid Obesity Preferred learning style: No preference indicated Learning readiness: Contemplating   NUTRITION ASSESSMENT   Anthropometrics  Ht: 5'2" Wt: 246.8 lbs Body mass index is 45.14 kg/m.   Clinical Medical Hx: Dysmenorrhea, HTN, Hypercholesterolemia Medications: Wellbutrin, Buspar, Klonopin, Effexor, Trazadone Labs: TC - 219, TGL - 189, HDL - 103, Glucose - 106 Notable Signs/Symptoms: N/A  Lifestyle & Dietary Hx Pt reports wanting to work on lifestyle change and weight loss. Pt is more concerned with the physical changes to their body, and improved quality of life instead of a numerical weight goal. Pt reports exercising with a trainer last year and lost ~30 pounds in about 4-5 months, then hit a plateua of weight loss. Pt reports frustration that led to cancelling their training sessions and ordering take-out more. Pt was cooking their meals to include lean meats and vegetables, while also weighing macros when they were working out and losing weight. Pt reports a current meal schedule of ~3.5 hours between breakfast and lunch, and 6.5 hours between lunch and dinner. This is built around their work schedule. Pt reports liking green beans, asparagus, brussel sprouts, tomatoes, broccolini, bell peppers, mushrooms, onions, romaine, and arugala as non-starchy vegetables.   Estimated daily fluid intake: +128 oz Supplements: MVU, D3, Vitamin C Sleep: Struggled with sleep, will wake up multiple times at night. Family history of OSA. Stress / self-care: 6/10, work is pt biggest stressor, listens to calming music, does skin treatments, or watches TV at home to [employer] after work. Current average weekly physical activity: ADLs   24-Hr Dietary Recall First Meal: Oikos Triple zero greek yogurt, coffee w/ mocha creamer Snack:  none Second Meal: Baked Ziti, garlic bread Snack: none Third Meal: Baked Ziti Snack: none Beverages: water, seltzer waters   NUTRITION DIAGNOSIS  NB-1.1 Food and nutrition-related knowledge deficit As related to obesity.  As evidenced by BMI of 45.14 kg/m2, sedentary lifestyle, and dietary recall high in energy dense foods..   NUTRITION INTERVENTION  Nutrition education (E-1) on the following topics:  Educated patient on the balanced plate eating model. Recommended lunch and dinner be 1/2 non-starchy vegetables, 1/4 starches, and 1/4 protein. Recommended breakfast be a balance of starch and protein with a piece of fruit. Discussed with patient the importance of working towards hitting the proportions of the balanced plate consistently. Counseled patient on ways to begin recognizing each of the food groups from the balanced plate in their own meals, and how close they are to fitting the recommended proportions of the balanced plate. Educated patient on the nutritional value of each food group on the balanced plate model.  Educated patient on the two components of energy balance: Energy in (calories), and energy out (activity). Explain the role of negative energy balance in weight loss. Discussed options with patient to achieve a negative energy balance and how to best control energy in and energy out to accommodate their lifestyle.   Handouts Provided Include  Energy Balance Worksheet Balanced Plate Balanced Plate Food List  Learning Style & Readiness for Change Teaching method utilized: Visual & Auditory  Demonstrated degree of understanding via: Teach Back  Barriers to learning/adherence to lifestyle change: None  Goals Established by Pt Consider contacting your PCP in regards to a referral for a sleep study. Add a balanced snack in between your lunch and dinner meal. Walk your dog around  your neighborhood 3 days a week for around 15 minutes. Work on Radiographer, therapeutic.  Highlight every little victory that you make in lowering your energy intake, as well as every time that you increase your physical activity.  MORE is always MORE, and LESS is always LESS. Begin to recognize carbohydrates, proteins, and non-starchy vegetables in your food choices! Begin to build your meals using the proportions of the Balanced Plate. First, select your carb choice(s) for the meal. Make this 25% of your meal. Next, select your source of protein to pair with your carb choice(s). Make this another 25% of your meal. Finally, complete your meal with a variety of non-starchy vegetables. Make this the remaining 50% of your meal.   MONITORING & EVALUATION Dietary intake, weekly physical activity, and weight loss in 2 months.  Next Steps  Patient is to track on their energy balance worksheet and follow up with RD.

## 2021-05-11 NOTE — Patient Instructions (Addendum)
Consider contacting your PCP in regards to a referral for a sleep study.  Add a balanced snack in between your lunch and dinner meal.  Walk your dog around your neighborhood 3 days a week for around 15 minutes.  Work on Engineer, manufacturing systems. Highlight every little victory that you make in lowering your energy intake, as well as every time that you increase your physical activity.   MORE is always MORE, and LESS is always LESS.  Begin to recognize carbohydrates, proteins, and non-starchy vegetables in your food choices!  Begin to build your meals using the proportions of the Balanced Plate. First, select your carb choice(s) for the meal. Make this 25% of your meal. Next, select your source of protein to pair with your carb choice(s). Make this another 25% of your meal. Finally, complete your meal with a variety of non-starchy vegetables. Make this the remaining 50% of your meal.

## 2021-07-11 ENCOUNTER — Encounter: Payer: BC Managed Care – PPO | Attending: Obstetrics & Gynecology | Admitting: Dietician

## 2021-07-11 ENCOUNTER — Encounter: Payer: Self-pay | Admitting: Dietician

## 2021-07-11 ENCOUNTER — Other Ambulatory Visit: Payer: Self-pay

## 2021-07-11 DIAGNOSIS — E669 Obesity, unspecified: Secondary | ICD-10-CM | POA: Insufficient documentation

## 2021-07-11 NOTE — Patient Instructions (Addendum)
Work backwards each day that you get home feeling higher levels of stress. Try to identify key areas that build onto your stress. Think of different ways to address these issues for our next visit! ? ?Pay attention to flavors and food combinations that you find enjoyable in your [employer] meals. Use this as inspiration to cook new things for yourself. ? ?Renew your passport when you have to renew your license. ? ? ?

## 2021-07-11 NOTE — Progress Notes (Signed)
Medical Nutrition Therapy  ?Appointment Start time:  1650  Appointment End time:  [id number] ? ?Primary concerns today: Lifestyle  ?Referral diagnosis: E66.01 - Morbid Obesity ?Preferred learning style: No preference indicated ?Learning readiness: Contemplating ? ? ?NUTRITION ASSESSMENT  ? ?Anthropometrics  ?Ht: 5'2" ?Wt: 246.8 lbs ?There is no height or weight on file to calculate BMI. ? ? ?Clinical ?Medical Hx: Dysmenorrhea, HTN, Hypercholesterolemia ?Medications: Wellbutrin, Buspar, Klonopin, Effexor, Trazadone ?Labs: TC - 219, TGL - 189, HDL - 103, Glucose - 106 ?Notable Signs/Symptoms: N/A ? ?Lifestyle & Dietary Hx ?Pt reports being inconsistent with making changes since our last visit. Pt states that they are tired all the time and it stands in the way of them finding the motivation to cook, shopping, and planning. Pt attributes this to work stress. Pt states that they are much more overstimulated at work recently, and they become more irritable. ?Pt states that this stress has contributed to them drinking more wine than usual. Pt states that they tend to eat more when they drink in the evening. ?Pt is trying [employer] meal  prep system that delivers all the ingredients them to prepare for 3 meals a week. Pt is hoping that this will inspire them to cook more and get back into the habit of meal prepping. ?Pt reports wanting to work on lifestyle change and weight loss. Pt is more concerned with the physical changes to their body, and improved quality of life instead of a numerical weight goal. ? ?Estimated daily fluid intake: +128 oz ?Supplements: MVU, D3, Vitamin C ?Sleep: Struggled with sleep, will wake up multiple times at night. Family history of OSA. ?Stress / self-care: 6/10, work is pt biggest stressor, listens to calming music, does skin treatments, or watches TV at home to [employer] after work. ?Current average weekly physical activity: ADLs  ? ?24-Hr Dietary Recall ?First Meal: [employer] Triple zero greek yogurt,  coffee w/ mocha creamer ?Snack: none ?Second Meal: Baked Ziti, garlic bread ?Snack: none ?Third Meal: Baked Ziti ?Snack: none ?Beverages: water, seltzer waters ? ? ?NUTRITION DIAGNOSIS  ?NB-1.1 Food and nutrition-related knowledge deficit As related to obesity.  As evidenced by BMI of 45.14 kg/m2, sedentary lifestyle, and dietary recall high in energy dense foods.. ? ? ?NUTRITION INTERVENTION  ?Nutrition education (E-1) on the following topics:  ?Educated patient on the balanced plate eating model. Recommended lunch and dinner be 1/2 non-starchy vegetables, 1/4 starches, and 1/4 protein. Recommended breakfast be a balance of starch and protein with a piece of fruit. Discussed with patient the importance of working towards hitting the proportions of the balanced plate consistently. Counseled patient on ways to begin recognizing each of the food groups from the balanced plate in their own meals, and how close they are to fitting the recommended proportions of the balanced plate. Educated patient on the nutritional value of each food group on the balanced plate model.  ?Educated patient on the two components of energy balance: Energy in (calories), and energy out (activity). Explain the role of negative energy balance in weight loss. Discussed options with patient to achieve a negative energy balance and how to best control energy in and energy out to accommodate their lifestyle. ?NEW: Educated patient on identifying factors that lead to undesirable behaviors and looking backwards at the chain of behaviors or actions that led to this behavior. Educate patient on the importance of intervening at any one of these points to change the trajectory of their decision making. ? ? ? ?Handouts Provided Include  ?  Energy Balance Worksheet ?Balanced Plate ?Balanced Plate Food List ? ?Learning Style & Readiness for Change ?Teaching method utilized: Visual & Auditory  ?Demonstrated degree of understanding via: Teach Back  ?Barriers  to learning/adherence to lifestyle change: None ? ?Goals Established by Pt ?Work backwards each day that you get home feeling higher levels of stress. Try to identify key areas that build onto your stress. Think of different ways to address these issues for our next visit! ?Pay attention to flavors and food combinations that you find enjoyable in your [employer] meals. Use this as inspiration to cook new things for yourself. ?Renew your passport when you have to renew your license. ? ? ?MONITORING & EVALUATION ?Dietary intake, weekly physical activity, and weight loss in 2 months. ? ?Next Steps  ?Patient is to identify events that lead to stress, and follow up with RD. ? ?

## 2021-09-13 ENCOUNTER — Ambulatory Visit: Payer: BC Managed Care – PPO | Admitting: Dietician

## 2021-09-20 ENCOUNTER — Ambulatory Visit: Payer: BC Managed Care – PPO | Admitting: Skilled Nursing Facility1

## 2022-03-01 ENCOUNTER — Other Ambulatory Visit (HOSPITAL_COMMUNITY): Payer: Self-pay | Admitting: Family

## 2022-03-01 DIAGNOSIS — R1011 Right upper quadrant pain: Secondary | ICD-10-CM

## 2022-03-07 ENCOUNTER — Encounter (HOSPITAL_COMMUNITY): Payer: Self-pay

## 2022-03-07 ENCOUNTER — Ambulatory Visit (HOSPITAL_COMMUNITY): Payer: BC Managed Care – PPO

## 2022-03-26 ENCOUNTER — Other Ambulatory Visit: Payer: Self-pay | Admitting: Obstetrics & Gynecology

## 2022-03-28 NOTE — Telephone Encounter (Signed)
Medication refill request: syeda  Last AEX:  03/09/21 Next AEX: 04/19/22 Last MMG (if hormonal medication request): n/a Refill authorized: #[id number] with 0 rf pended for today to get her to her aex in [place].

## 2022-04-19 ENCOUNTER — Other Ambulatory Visit (HOSPITAL_COMMUNITY)
Admission: RE | Admit: 2022-04-19 | Discharge: 2022-04-19 | Disposition: A | Discharge status: Home / Self Care | Payer: BC Managed Care – PPO | Source: Ambulatory Visit | Attending: Obstetrics & Gynecology | Admitting: Obstetrics & Gynecology

## 2022-04-19 ENCOUNTER — Encounter: Payer: Self-pay | Admitting: Obstetrics & Gynecology

## 2022-04-19 ENCOUNTER — Ambulatory Visit (INDEPENDENT_AMBULATORY_CARE_PROVIDER_SITE_OTHER): Payer: BC Managed Care – PPO | Admitting: Obstetrics & Gynecology

## 2022-04-19 VITALS — BP 114/80 | HR 103 | Ht 62.25 in | Wt 230.0 lb

## 2022-04-19 DIAGNOSIS — Z01419 Encounter for gynecological examination (general) (routine) without abnormal findings: Secondary | ICD-10-CM | POA: Insufficient documentation

## 2022-04-19 DIAGNOSIS — Z3041 Encounter for surveillance of contraceptive pills: Secondary | ICD-10-CM

## 2022-04-19 MED ORDER — DROSPIRENONE-ETHINYL ESTRADIOL 3-0.03 MG PO TABS: 1.0000 | ORAL_TABLET | Freq: Every day | ORAL | 1 refills | Status: DC

## 2022-04-19 NOTE — Progress Notes (Signed)
Shannon Sherman 08-30-1985 [id number]   History:    37 y.o.  G0 Single.  [employer], [employer].   RP:  Established patient presenting for annual gyn exam    HPI: Well on Ocella continuously x 3 months at a time for dysmenorrhea and contraception.  But would like to switch to a contraceptive not affected by Zepbound and Topomax.  No BTB.  No pelvic pain.  Sexually active.  Declines STI screen.  Pap 11/2019 Neg.  Pap reflex today.  Urine/BMs normal.  Breasts normal. BMI decreased to 41.73 on Zepbound.  Fasting health labs with [facility]. Flu vaccine done at work   Past medical history,surgical history, family history and social history were all reviewed and documented in the EPIC chart.  Gynecologic History Patient's last menstrual period was 03/29/2022 (exact date).  Obstetric History OB History  Gravida Para Term Preterm AB Living  0            SAB IAB Ectopic Multiple Live Births                ROS: A ROS was performed and pertinent positives and negatives are included in the history. GENERAL: No fevers or chills. HEENT: No change in vision, no earache, sore throat or sinus congestion. NECK: No pain or stiffness. CARDIOVASCULAR: No chest pain or pressure. No palpitations. PULMONARY: No shortness of breath, cough or wheeze. GASTROINTESTINAL: No abdominal pain, nausea, vomiting or diarrhea, melena or bright red blood per rectum. GENITOURINARY: No urinary frequency, urgency, hesitancy or dysuria. MUSCULOSKELETAL: No joint or muscle pain, no back pain, no recent trauma. DERMATOLOGIC: No rash, no itching, no lesions. ENDOCRINE: No polyuria, polydipsia, no heat or cold intolerance. No recent change in weight. HEMATOLOGICAL: No anemia or easy bruising or bleeding. NEUROLOGIC: No headache, seizures, numbness, tingling or weakness. PSYCHIATRIC: No depression, no loss of interest in normal activity or change in sleep pattern.     Exam:   BP 114/80   Pulse (!) 103   Ht 5' 2.25" (1.581 m)   Wt 230  lb (104.3 kg)   LMP 03/29/2022 (Exact Date) Comment: gets cycle every 79mths with birth control  SpO2 99%   BMI 41.73 kg/m   Body mass index is 41.73 kg/m.  General appearance : Well developed well nourished female. No acute distress HEENT: Eyes: no retinal hemorrhage or exudates,  Neck supple, trachea midline, no carotid bruits, no thyroidmegaly Lungs: Clear to auscultation, no rhonchi or wheezes, or rib retractions  Heart: Regular rate and rhythm, no murmurs or gallops Breast:Examined in sitting and supine position were symmetrical in appearance, no palpable masses or tenderness,  no skin retraction, no nipple inversion, no nipple discharge, no skin discoloration, no axillary or supraclavicular lymphadenopathy Abdomen: no palpable masses or tenderness, no rebound or guarding Extremities: no edema or skin discoloration or tenderness  Pelvic: Vulva: Normal             Vagina: No gross lesions or discharge  Cervix: No gross lesions or discharge.  Pap reflex done.  Uterus  AV, normal size, shape and consistency, non-tender and mobile  Adnexa  Without masses or tenderness  Anus: Normal   Assessment/Plan:  37 y.o. female for annual exam   1. Encounter for routine gynecological examination with Papanicolaou smear of cervix Well on Ocella continuously x 3 months at a time for dysmenorrhea and contraception.  But would like to switch to a contraceptive not affected by Zepbound and Topomax.  No BTB. No pelvic pain.  Sexually active.  Declines STI screen.  Pap 11/2019 Neg.  Pap reflex today.  Urine/BMs normal.  Breasts normal. BMI decreased to 41.73 on Zepbound.  Fasting health labs with [facility]. Flu vaccine done at work  - Cytology - PAP( [facility])  2. Encounter for surveillance of contraceptive pills Well on Ocella continuously x 3 months at a time for dysmenorrhea and contraception.  But would like to switch to a contraceptive not affected by Zepbound and Topomax.  No BTB. No pelvic pain.   Sexually active.  Alternatives to BCPs discussed.  Risks/Benefits of ParaGard IUD and Skyla IUD discussed.  Decision to switch to [facility] IUD after counseling.  Patient voiced understanding and agreement with the plan. - IUD Insertion; Future  3. Morbid obesity ([place]) On Zepbound.  Low calorie diet.  Fitness activities.  Other orders - traZODone (DESYREL) 50 MG tablet; Take 50 mg by mouth at bedtime. - topiramate (TOPAMAX) 25 MG tablet; Take 25 mg by mouth daily. - ZEPBOUND 2.5 MG/0.5ML Pen; Inject into the skin. - hydrochlorothiazide (HYDRODIURIL) 25 MG tablet; Take 25 mg by mouth daily. - cyanocobalamin (VITAMIN B12) 1000 MCG/ML injection; Inject into the muscle. - drospirenone-ethinyl estradiol (SYEDA) 3-0.03 MG tablet; Take 1 tablet by mouth daily.   Princess Bruins MD, 4:08 PM

## 2022-04-21 LAB — CYTOLOGY - PAP: Diagnosis: NEGATIVE

## 2022-06-05 ENCOUNTER — Encounter: Payer: Self-pay | Admitting: Obstetrics & Gynecology

## 2022-06-05 ENCOUNTER — Ambulatory Visit (INDEPENDENT_AMBULATORY_CARE_PROVIDER_SITE_OTHER): Payer: BC Managed Care – PPO | Admitting: Obstetrics & Gynecology

## 2022-06-05 VITALS — BP 122/80 | HR 94

## 2022-06-05 DIAGNOSIS — Z30430 Encounter for insertion of intrauterine contraceptive device: Secondary | ICD-10-CM | POA: Diagnosis not present

## 2022-06-05 DIAGNOSIS — Z3041 Encounter for surveillance of contraceptive pills: Secondary | ICD-10-CM

## 2022-06-05 NOTE — Progress Notes (Signed)
    Sherna Zawadzki 08-Feb-1986 [id number]        37 y.o.  G0  RP: Skyla IUD insertion  HPI: On Ocella, withdrawal week currently.  Light menses.  No pelvic pain.     OB History  Gravida Para Term Preterm AB Living  0 0 0 0 0 0  SAB IAB Ectopic Multiple Live Births  0 0 0 0 0    Past medical history,surgical history, problem list, medications, allergies, family history and social history were all reviewed and documented in the EPIC chart.   Directed ROS with pertinent positives and negatives documented in the history of present illness/assessment and plan.  Exam:  Vitals:   06/05/22 1524  BP: 122/80  Pulse: 94   General appearance:  Normal                              IUD procedure note       Patient presented to the office today for placement of Skyla IUD. The patient had previously been provided with literature information on this method of contraception. The risks benefits and pros and cons were discussed and all her questions were answered. She is fully aware that this form of contraception is 99% effective and is good for 3 years.  Pelvic exam: Vulva normal Vagina: No lesions or discharge Cervix: No lesions or discharge Uterus: AV position Adnexa: No masses or tenderness Rectal exam: Not done  The cervix was cleansed with Betadine solution. Hurricane spray on the cervix.  A single-tooth tenaculum was placed on the anterior cervical lip. Os finder hysterometry at 7 cm. The IUD was shown to the patient and inserted in a sterile fashion.  The IUD string was trimmed. The single-tooth tenaculum was removed. Patient was instructed to return back to the office in one month for follow up. Patient had a severe uterine cramp, but no Cx.    Assessment/Plan:  37 y.o. G0P0000   1. Encounter for IUD insertion On Ocella, withdrawal week currently.  Light menses.  No pelvic pain.  Easy Skyla insertion after informed consent. No Cx.  Post procedure precautions. F/U in 4 weeks for  IUD check. - IUD Insertion   2. Encounter for surveillance of contraceptive pills D/C BCPs.  Princess Bruins MD, 3:41 PM 06/05/2022

## 2022-06-09 ENCOUNTER — Encounter: Payer: Self-pay | Admitting: Obstetrics & Gynecology

## 2022-06-12 ENCOUNTER — Other Ambulatory Visit: Payer: Self-pay | Admitting: Obstetrics & Gynecology

## 2022-06-12 NOTE — Telephone Encounter (Signed)
Pt scheduled for 06/13/2022.

## 2022-06-12 NOTE — Telephone Encounter (Signed)
Per ML: "Yes, schedule wet prep/possible SureSwab Advanced Plus."

## 2022-06-13 ENCOUNTER — Encounter: Payer: Self-pay | Admitting: Obstetrics & Gynecology

## 2022-06-13 ENCOUNTER — Ambulatory Visit (INDEPENDENT_AMBULATORY_CARE_PROVIDER_SITE_OTHER): Payer: BC Managed Care – PPO | Admitting: Obstetrics & Gynecology

## 2022-06-13 VITALS — BP 118/74 | HR 98

## 2022-06-13 DIAGNOSIS — N760 Acute vaginitis: Secondary | ICD-10-CM

## 2022-06-13 DIAGNOSIS — Z30431 Encounter for routine checking of intrauterine contraceptive device: Secondary | ICD-10-CM

## 2022-06-13 DIAGNOSIS — N898 Other specified noninflammatory disorders of vagina: Secondary | ICD-10-CM

## 2022-06-13 LAB — WET PREP FOR TRICH, YEAST, CLUE

## 2022-06-13 NOTE — Progress Notes (Signed)
    Shannon Sherman 1986-01-19 [id number]        37 y.o.  G0  RP: Vaginal odors x 1 week  HPI: C/O vaginal odors x last week, improved now.  Otherwise well on Skyla IUD x 06/05/22.  LMP 06/01/22.  No pelvic pain. No pain with IC.  Urine/BMs normal.  No fever.   OB History  Gravida Para Term Preterm AB Living  0 0 0 0 0 0  SAB IAB Ectopic Multiple Live Births  0 0 0 0 0    Past medical history,surgical history, problem list, medications, allergies, family history and social history were all reviewed and documented in the EPIC chart.   Directed ROS with pertinent positives and negatives documented in the history of present illness/assessment and plan.  Exam:  Vitals:   06/13/22 1421  BP: 118/74  Pulse: 98  SpO2: 99%   General appearance:  Normal  Abdomen: Normal  Gynecologic exam: Vulva normal.  Speculum:  Cervix normal.  IUD strings visible at EO.  Vagina normal.  Increase in vaginal discharge.  Wet prep/SureSwab Advanced Plus done.  Wet prep: Neg   Assessment/Plan:  37 y.o. G0  1. Vaginal odor C/O vaginal odors x last week, improved now.  Otherwise well on Skyla IUD x 06/05/22.  LMP 06/01/22.  No pelvic pain. No pain with IC.  Urine/BMs normal.  No fever.  Wet prep Neg. - WET PREP FOR TRICH, YEAST, CLUE  2. Vaginal discharge C/O vaginal odors x 1 week, improved now.  Otherwise well on Skyla IUD x 06/05/22.  LMP 06/01/22.  No pelvic pain. No pain with IC.  Urine/BMs normal.  No fever.  SureSwab Advanced Plus pending. - SureSwab Advanced Vaginitis Plus,TMA  3. IUD check up Skyla IUD in good position.  Other orders - ZEPBOUND 7.5 MG/0.5ML Pen; Inject into the skin. - Levonorgestrel (SKYLA) 13.5 MG IUD; by Intrauterine route.   Princess Bruins MD, 2:26 PM 06/13/2022

## 2022-06-14 LAB — SURESWAB® ADVANCED VAGINITIS PLUS,TMA
C. trachomatis RNA, TMA: NOT DETECTED
CANDIDA SPECIES: NOT DETECTED
Candida glabrata: NOT DETECTED
N. gonorrhoeae RNA, TMA: NOT DETECTED
SURESWAB(R) ADV BACTERIAL VAGINOSIS(BV),TMA: NEGATIVE
TRICHOMONAS VAGINALIS (TV),TMA: NOT DETECTED

## 2022-07-04 ENCOUNTER — Ambulatory Visit: Payer: BC Managed Care – PPO | Admitting: Obstetrics & Gynecology

## 2022-08-08 ENCOUNTER — Other Ambulatory Visit (HOSPITAL_COMMUNITY): Payer: Self-pay

## 2022-08-08 MED ORDER — ZEPBOUND 15 MG/0.5ML ~~LOC~~ SOAJ
15.0000 mg | SUBCUTANEOUS | 1 refills | Status: AC
  Filled 2022-08-08 (×2): qty 2, 28d supply, fill #0

## 2022-08-10 ENCOUNTER — Other Ambulatory Visit (HOSPITAL_COMMUNITY): Payer: Self-pay

## 2022-11-15 ENCOUNTER — Other Ambulatory Visit: Payer: Self-pay | Admitting: Obstetrics & Gynecology
